# Patient Record
Sex: Female | Born: 1990 | Hispanic: Yes | Marital: Single | Smoking: Current every day smoker
Health system: Southern US, Community
[De-identification: ages and names within clinical notes are randomized; demographics above are authoritative.]

---

## 2017-07-24 ENCOUNTER — Emergency Department (HOSPITAL_COMMUNITY)
Admission: EM | Admit: 2017-07-24 | Discharge: 2017-07-24 | Disposition: A | Discharge status: Home / Self Care | Payer: Self-pay | Attending: Emergency Medicine | Admitting: Emergency Medicine

## 2017-07-24 ENCOUNTER — Emergency Department (HOSPITAL_COMMUNITY): Payer: Self-pay

## 2017-07-24 ENCOUNTER — Encounter (HOSPITAL_COMMUNITY): Payer: Self-pay | Admitting: Physician Assistant

## 2017-07-24 DIAGNOSIS — R102 Pelvic and perineal pain: Secondary | ICD-10-CM | POA: Insufficient documentation

## 2017-07-24 DIAGNOSIS — N898 Other specified noninflammatory disorders of vagina: Secondary | ICD-10-CM | POA: Insufficient documentation

## 2017-07-24 LAB — CBC WITH DIFFERENTIAL/PLATELET
Abs Immature Granulocytes: 0 10*3/uL (ref 0.0–0.1)
Basophils Absolute: 0 10*3/uL (ref 0.0–0.1)
Basophils Relative: 0 %
Eosinophils Absolute: 0.4 10*3/uL (ref 0.0–0.7)
Eosinophils Relative: 4 %
HCT: 43.8 % (ref 36.0–46.0)
Hemoglobin: 13.9 g/dL (ref 12.0–15.0)
Immature Granulocytes: 0 %
Lymphocytes Relative: 26 %
Lymphs Abs: 2.9 10*3/uL (ref 0.7–4.0)
MCH: 28 pg (ref 26.0–34.0)
MCHC: 31.7 g/dL (ref 30.0–36.0)
MCV: 88.3 fL (ref 78.0–100.0)
Monocytes Absolute: 0.7 10*3/uL (ref 0.1–1.0)
Monocytes Relative: 7 %
Neutro Abs: 7.1 10*3/uL (ref 1.7–7.7)
Neutrophils Relative %: 63 %
Platelets: 183 10*3/uL (ref 150–400)
RBC: 4.96 MIL/uL (ref 3.87–5.11)
RDW: 12.8 % (ref 11.5–15.5)
WBC: 11.2 10*3/uL — ABNORMAL HIGH (ref 4.0–10.5)

## 2017-07-24 LAB — COMPREHENSIVE METABOLIC PANEL
ALT: 36 U/L (ref 0–44)
AST: 38 U/L (ref 15–41)
Albumin: 4 g/dL (ref 3.5–5.0)
Alkaline Phosphatase: 64 U/L (ref 38–126)
Anion gap: 10 (ref 5–15)
BUN: <5 mg/dL — ABNORMAL LOW (ref 6–20)
CO2: 22 mmol/L (ref 22–32)
Calcium: 9.3 mg/dL (ref 8.9–10.3)
Chloride: 109 mmol/L (ref 98–111)
Creatinine, Ser: 0.69 mg/dL (ref 0.44–1.00)
GFR calc Af Amer: >60 mL/min (ref >60)
GFR calc non Af Amer: >60 mL/min (ref >60)
Glucose, Bld: 98 mg/dL (ref 70–99)
Potassium: 4.2 mmol/L (ref 3.5–5.1)
Sodium: 141 mmol/L (ref 135–145)
Total Bilirubin: 0.6 mg/dL (ref 0.3–1.2)
Total Protein: 7.2 g/dL (ref 6.5–8.1)

## 2017-07-24 LAB — URINALYSIS, ROUTINE W REFLEX MICROSCOPIC
Bilirubin Urine: NEGATIVE
Glucose, UA: NEGATIVE mg/dL
Hgb urine dipstick: NEGATIVE
Ketones, ur: NEGATIVE mg/dL
Nitrite: NEGATIVE
Protein, ur: NEGATIVE mg/dL
Specific Gravity, Urine: 1.013 (ref 1.005–1.030)
pH: 5 (ref 5.0–8.0)

## 2017-07-24 LAB — WET PREP, GENITAL
Sperm: NONE SEEN
Trich, Wet Prep: NONE SEEN
Yeast Wet Prep HPF POC: NONE SEEN

## 2017-07-24 LAB — PREGNANCY, URINE: PREG TEST UR: NEGATIVE

## 2017-07-24 LAB — LIPASE, BLOOD: Lipase: 28 U/L (ref 11–51)

## 2017-07-24 MED ORDER — AZITHROMYCIN 250 MG PO TABS
1000.0000 mg | ORAL_TABLET | Freq: Once | ORAL | Status: AC
  Administered 2017-07-24: 1000 mg via ORAL
  Filled 2017-07-24: qty 4

## 2017-07-24 MED ORDER — METRONIDAZOLE 500 MG PO TABS: 500.0000 mg | ORAL_TABLET | Freq: Two times a day (BID) | ORAL | 0 refills | Status: DC

## 2017-07-24 MED ORDER — OXYCODONE-ACETAMINOPHEN 5-325 MG PO TABS
1.0000 | ORAL_TABLET | Freq: Once | ORAL | Status: AC
  Administered 2017-07-24: 1 via ORAL
  Filled 2017-07-24: qty 1

## 2017-07-24 MED ORDER — LIDOCAINE HCL (PF) 1 % IJ SOLN
INTRAMUSCULAR | Status: AC
  Administered 2017-07-24: 0.9 mL
  Filled 2017-07-24: qty 5

## 2017-07-24 MED ORDER — HYDROCODONE-ACETAMINOPHEN 5-325 MG PO TABS: 1.0000 | ORAL_TABLET | Freq: Four times a day (QID) | ORAL | 0 refills | Status: DC | PRN

## 2017-07-24 MED ORDER — CEFTRIAXONE SODIUM 250 MG IJ SOLR
250.0000 mg | Freq: Once | INTRAMUSCULAR | Status: AC
  Administered 2017-07-24: 250 mg via INTRAMUSCULAR
  Filled 2017-07-24: qty 250

## 2017-07-24 NOTE — ED Triage Notes (Signed)
Per translator: Patient to ED c/o lower abdominal pain and pelvic pain x 3 days, worse today. She reports it feels cramping like she's on her menstrual cycle, but she just had it last week. Noticed some bloody and yellow discharge in urine. No vaginal discharge, no N/V/D or fevers.

## 2017-07-24 NOTE — Discharge Instructions (Addendum)
Please read attached information. If you experience any new or worsening signs or symptoms please return to the emergency room for evaluation. Please follow-up with your primary care provider or specialist as discussed. Please use medication prescribed only as directed and discontinue taking if you have any concerning signs or symptoms.   °

## 2017-07-24 NOTE — ED Provider Notes (Signed)
[facility] EMERGENCY DEPARTMENT Provider Note   CSN: [id number] Arrival date & time: 07/24/17  1408   History   Chief Complaint Chief Complaint  Patient presents with  . Abdominal Pain    HPI Joyce Ryan is a 27 y.o. female.  HPI Spanish interpreter used  27 year old female presents today with complaints of pelvic pain.  Patient notes 3 days of pelvic pain, vaginal discharge.  She denies any fever chills, she does note vomiting.  Patient reports she is sexually active with men.  She notes the pain is constant with episodes of sharp pain in the pelvis.  Patient denies any change in bowel movement, denies any urinary changes.  Patient notes this was somewhat rapid onset.    History reviewed. No pertinent past medical history.  There are no active problems to display for this patient.   History reviewed. No pertinent surgical history.   OB History   None      Home Medications    Prior to Admission medications   Medication Sig Start Date End Date Taking? Authorizing Provider  HYDROcodone-acetaminophen (NORCO/VICODIN) 5-325 MG tablet Take 1 tablet by mouth every 6 (six) hours as needed. 07/24/17   Leyah Bocchino, Tinnie Gens, PA-C  metroNIDAZOLE (FLAGYL) 500 MG tablet Take 1 tablet (500 mg total) by mouth 2 (two) times daily. 07/24/17   Eyvonne Mechanic, PA-C    Family History No family history on file.  Social History Social History   Tobacco Use  . Smoking status: Not on file  Substance Use Topics  . Alcohol use: Not on file  . Drug use: Not on file     Allergies   Patient has no known allergies.   Review of Systems Review of Systems  All other systems reviewed and are negative.    Physical Exam Updated Vital Signs BP 128/69 (BP Location: Right Arm)   Pulse 75   Temp 98.1 F (36.7 C) (Oral)   Resp 17   Ht 5\' 4"  (1.626 m)   Wt 67.6 kg (149 lb)   LMP 07/20/2017 (Approximate)   SpO2 99%   BMI 25.58 kg/m   Physical Exam    Constitutional: She is oriented to person, place, and time. She appears well-developed and well-nourished.  HENT:  Head: Normocephalic and atraumatic.  Eyes: Pupils are equal, round, and reactive to light. Conjunctivae are normal. Right eye exhibits no discharge. Left eye exhibits no discharge. No scleral icterus.  Neck: Normal range of motion. No JVD present. No tracheal deviation present.  Pulmonary/Chest: Effort normal. No stridor.  Abdominal: Soft.  Minor tenderness palpation of the lower pelvis diffusely worse on the right-remainder of abdomen soft nontender  Genitourinary:  Genitourinary Comments: Small amount of yellow vaginal discharge noted in the vaginal vault, no cervical motion tenderness adnexal tenderness or masses  Neurological: She is alert and oriented to person, place, and time. Coordination normal.  Psychiatric: She has a normal mood and affect. Her behavior is normal. Judgment and thought content normal.  Nursing note and vitals reviewed.    ED Treatments / Results  Labs (all labs ordered are listed, but only abnormal results are displayed) Labs Reviewed  WET PREP, GENITAL - Abnormal; Notable for the following components:      Result Value   Clue Cells Wet Prep HPF POC PRESENT (*)    WBC, Wet Prep HPF POC MANY (*)    All other components within normal limits  CBC WITH DIFFERENTIAL/PLATELET - Abnormal; Notable for the following components:  WBC 11.2 (*)    All other components within normal limits  COMPREHENSIVE METABOLIC PANEL - Abnormal; Notable for the following components:   BUN <5 (*)    All other components within normal limits  URINALYSIS, ROUTINE W REFLEX MICROSCOPIC - Abnormal; Notable for the following components:   Leukocytes, UA TRACE (*)    Bacteria, UA RARE (*)    All other components within normal limits  LIPASE, BLOOD  PREGNANCY, URINE  POC URINE PREG, ED  GC/CHLAMYDIA PROBE AMP ([place]) NOT AT [facility]    EKG None  Radiology Koreas  Transvaginal Non-ob  Result Date: 07/24/2017 CLINICAL DATA:  Initial evaluation for acute lower abdominal pain for 3 days. EXAM: TRANSABDOMINAL AND TRANSVAGINAL ULTRASOUND OF PELVIS DOPPLER ULTRASOUND OF OVARIES TECHNIQUE: Both transabdominal and transvaginal ultrasound examinations of the pelvis were performed. Transabdominal technique was performed for global imaging of the pelvis including uterus, ovaries, adnexal regions, and pelvic cul-de-sac. It was necessary to proceed with endovaginal exam following the transabdominal exam to visualize the uterus, endometrium, and ovaries. Color and duplex Doppler ultrasound was utilized to evaluate blood flow to the ovaries. COMPARISON:  None. FINDINGS: Uterus Measurements: 8.0 x 4.2 x 4.5 cm. No fibroids or other mass visualized. Endometrium Thickness: 6.9 mm.  No focal abnormality visualized. Right ovary Measurements: 3.8 x 2.4 x 2.0 cm. Normal appearance/no adnexal mass. Left ovary Measurements: 3.4 x 2.3 x 2.3 cm. 2.0 x 1.9 x 1.8 cm simple cyst, most consistent with a normal physiologic cyst/dominant follicle. Pulsed Doppler evaluation of both ovaries demonstrates normal low-resistance arterial and venous waveforms. Other findings No abnormal free fluid. IMPRESSION: Normal pelvic ultrasound for age. No evidence for ovarian torsion or other acute abnormality. Electronically Signed   By: Rise MuBenjamin  McClintock M.D.   On: 07/24/2017 19:12   [place] Pelvis Complete  Result Date: 07/24/2017 CLINICAL DATA:  Initial evaluation for acute lower abdominal pain for 3 days. EXAM: TRANSABDOMINAL AND TRANSVAGINAL ULTRASOUND OF PELVIS DOPPLER ULTRASOUND OF OVARIES TECHNIQUE: Both transabdominal and transvaginal ultrasound examinations of the pelvis were performed. Transabdominal technique was performed for global imaging of the pelvis including uterus, ovaries, adnexal regions, and pelvic cul-de-sac. It was necessary to proceed with endovaginal exam following the transabdominal exam to  visualize the uterus, endometrium, and ovaries. Color and duplex Doppler ultrasound was utilized to evaluate blood flow to the ovaries. COMPARISON:  None. FINDINGS: Uterus Measurements: 8.0 x 4.2 x 4.5 cm. No fibroids or other mass visualized. Endometrium Thickness: 6.9 mm.  No focal abnormality visualized. Right ovary Measurements: 3.8 x 2.4 x 2.0 cm. Normal appearance/no adnexal mass. Left ovary Measurements: 3.4 x 2.3 x 2.3 cm. 2.0 x 1.9 x 1.8 cm simple cyst, most consistent with a normal physiologic cyst/dominant follicle. Pulsed Doppler evaluation of both ovaries demonstrates normal low-resistance arterial and venous waveforms. Other findings No abnormal free fluid. IMPRESSION: Normal pelvic ultrasound for age. No evidence for ovarian torsion or other acute abnormality. Electronically Signed   By: Rise MuBenjamin  McClintock M.D.   On: 07/24/2017 19:12   [place] Art/ven Flow Abd Pelv Doppler  Result Date: 07/24/2017 CLINICAL DATA:  Initial evaluation for acute lower abdominal pain for 3 days. EXAM: TRANSABDOMINAL AND TRANSVAGINAL ULTRASOUND OF PELVIS DOPPLER ULTRASOUND OF OVARIES TECHNIQUE: Both transabdominal and transvaginal ultrasound examinations of the pelvis were performed. Transabdominal technique was performed for global imaging of the pelvis including uterus, ovaries, adnexal regions, and pelvic cul-de-sac. It was necessary to proceed with endovaginal exam following the transabdominal exam to visualize the uterus, endometrium,  and ovaries. Color and duplex Doppler ultrasound was utilized to evaluate blood flow to the ovaries. COMPARISON:  None. FINDINGS: Uterus Measurements: 8.0 x 4.2 x 4.5 cm. No fibroids or other mass visualized. Endometrium Thickness: 6.9 mm.  No focal abnormality visualized. Right ovary Measurements: 3.8 x 2.4 x 2.0 cm. Normal appearance/no adnexal mass. Left ovary Measurements: 3.4 x 2.3 x 2.3 cm. 2.0 x 1.9 x 1.8 cm simple cyst, most consistent with a normal physiologic cyst/dominant  follicle. Pulsed Doppler evaluation of both ovaries demonstrates normal low-resistance arterial and venous waveforms. Other findings No abnormal free fluid. IMPRESSION: Normal pelvic ultrasound for age. No evidence for ovarian torsion or other acute abnormality. Electronically Signed   By: Rise Mu M.D.   On: 07/24/2017 19:12    Procedures Procedures (including critical care time)  Medications Ordered in ED Medications  lidocaine (PF) (XYLOCAINE) 1 % injection (has no administration in time range)  oxyCODONE-acetaminophen (PERCOCET/ROXICET) 5-325 MG per tablet 1 tablet (1 tablet Oral Given 07/24/17 1713)  cefTRIAXone (ROCEPHIN) injection 250 mg (250 mg Intramuscular Given 07/24/17 1950)  azithromycin (ZITHROMAX) tablet 1,000 mg (1,000 mg Oral Given 07/24/17 1949)     Initial Impression / Assessment and Plan / ED Course  I have reviewed the triage vital signs and the nursing notes.  Pertinent labs & imaging results that were available during my care of the patient were reviewed by me and considered in my medical decision making (see chart for details).     Labs: Wet prep, urinalysis, urine prior, CBC, CMP, lipase  Imaging: Ultrasound pelvis complete  Consults:  Therapeutics: Ceftriaxone, azithromycin  Discharge Meds: Metronidazole, Vicodin  Assessment/Plan: 27 year old female presents today with complaints of pelvic pain and vaginal discharge.  Patient's exam concerning for STD, she has a painful exam with no significant changes with cervical motion, no true cervical motion tenderness, no adnexal tenderness or masses.  Ultrasound reassuring with no signs of torsion.  Remainder of patient's work-up reassuring with no fever.  No signs of PID, very low suspicion for acute appendicitis.  I had a discussion with the patient, she will be treated here prophylactically for STDs discharge with metronidazole for bacterial vaginosis.  If patients symptoms continue to persist beyond 2 days  she will return to the emergency room for repeat evaluation, if she has any new or worsening signs or symptoms she will return immediately.  Patient verbalized understanding and agreement to today's plan had no further questions or concerns at time discharge.    Final Clinical Impressions(s) / ED Diagnoses   Final diagnoses:  Vaginal discharge  Pelvic pain    ED Discharge Orders        Ordered    metroNIDAZOLE (FLAGYL) 500 MG tablet  2 times daily     07/24/17 1949    HYDROcodone-acetaminophen (NORCO/VICODIN) 5-325 MG tablet  Every 6 hours PRN     07/24/17 1949       Rosalio Loud 07/24/17 Rayne Du, MD 07/27/17 1006

## 2017-07-24 NOTE — ED Provider Notes (Addendum)
Patient placed in Quick Look pathway, seen and evaluated   Chief Complaint: abdominal pain  HPI:   Patient presents for evaluation of 3 days of lower abdominal pain, constant. Notes nausea but no vomiting. Thinks she may have had hematuria this morning. Denies vaginal itching, bleeding, discharge out of the ordinary. Denies fevers, diarrhea, constipation.patient is primarily Spanish-speaking, translator used throughout the duration of the encounter.  ROS: positive for abdominal pain, hematuria, nausea Negative for vomiting, fever, diarrhea, constipation  Physical Exam:   Gen: intermittently tearful  Neuro: Awake and Alert  Skin: Warm    Focused Exam:tenderness to palpation of the lower abdomen. Murphy sign absent, Rovsing's absent, mild bilateral CVA tenderness present. No peritoneal signs.   Initiation of care has begun. The patient has been counseled on the process, plan, and necessity for staying for the completion/evaluation, and the remainder of the medical screening examination     Jeanie SewerFawze, Catalaya Garr A, PA-C 07/24/17 1521    Blane OharaZavitz, Joshua, MD 07/25/17 1047

## 2017-07-25 LAB — GC/CHLAMYDIA PROBE AMP (~~LOC~~) NOT AT ARMC
Chlamydia: NEGATIVE
Neisseria Gonorrhea: NEGATIVE

## 2018-02-21 ENCOUNTER — Other Ambulatory Visit: Payer: Self-pay

## 2018-02-21 ENCOUNTER — Encounter (HOSPITAL_COMMUNITY): Payer: Self-pay | Admitting: *Deleted

## 2018-02-21 ENCOUNTER — Emergency Department (HOSPITAL_COMMUNITY)
Admission: EM | Admit: 2018-02-21 | Discharge: 2018-02-21 | Disposition: A | Discharge status: Home / Self Care | Payer: Self-pay | Attending: Emergency Medicine | Admitting: Emergency Medicine

## 2018-02-21 ENCOUNTER — Emergency Department (HOSPITAL_COMMUNITY): Payer: Self-pay

## 2018-02-21 DIAGNOSIS — O99331 Smoking (tobacco) complicating pregnancy, first trimester: Secondary | ICD-10-CM | POA: Insufficient documentation

## 2018-02-21 DIAGNOSIS — O9989 Other specified diseases and conditions complicating pregnancy, childbirth and the puerperium: Secondary | ICD-10-CM | POA: Insufficient documentation

## 2018-02-21 DIAGNOSIS — Z3A01 Less than 8 weeks gestation of pregnancy: Secondary | ICD-10-CM | POA: Insufficient documentation

## 2018-02-21 DIAGNOSIS — R1030 Lower abdominal pain, unspecified: Secondary | ICD-10-CM | POA: Insufficient documentation

## 2018-02-21 DIAGNOSIS — F17210 Nicotine dependence, cigarettes, uncomplicated: Secondary | ICD-10-CM | POA: Insufficient documentation

## 2018-02-21 LAB — ABO/RH: ABO/RH(D): A POS

## 2018-02-21 LAB — URINALYSIS, ROUTINE W REFLEX MICROSCOPIC
Bilirubin Urine: NEGATIVE
Glucose, UA: NEGATIVE mg/dL
Hgb urine dipstick: NEGATIVE
Ketones, ur: NEGATIVE mg/dL
LEUKOCYTES UA: NEGATIVE
Nitrite: NEGATIVE
PROTEIN: NEGATIVE mg/dL
SPECIFIC GRAVITY, URINE: 1.023 (ref 1.005–1.030)
pH: 7 (ref 5.0–8.0)

## 2018-02-21 LAB — WET PREP, GENITAL
CLUE CELLS WET PREP: NONE SEEN
Sperm: NONE SEEN
TRICH WET PREP: NONE SEEN
YEAST WET PREP: NONE SEEN

## 2018-02-21 LAB — POC URINE PREG, ED: Preg Test, Ur: POSITIVE — AB

## 2018-02-21 LAB — HCG, QUANTITATIVE, PREGNANCY: hCG, Beta Chain, Quant, S: 2723 m[IU]/mL — ABNORMAL HIGH (ref <5)

## 2018-02-21 NOTE — ED Notes (Signed)
Patient transported to Ultrasound 

## 2018-02-21 NOTE — ED Provider Notes (Signed)
Assumed care of patient from Dr. Madilyn Hook at change of care, briefly, normal pelvic exam, cc pain and vaginal dc. Awaiting [place] and quant. No bleeding, wet prep normal.  Physical Exam  BP 137/90 (BP Location: Left Arm)   Pulse 72   Temp 98.7 F (37.1 C) (Oral)   Resp 18   Ht 5\' 4"  (1.626 m)   Wt 67.1 kg   LMP 01/18/2018   SpO2 99%   BMI 25.40 kg/m   Physical Exam  ED Course/Procedures     Procedures  MDM  Ultrasound returns with 5-week IUP, cardiac activity and fetal pole not seen, recommend follow-up in 2 weeks.  Patient advised to take prenatal vitamin daily and follow-up with women's outpatient clinic.       Jeannie Fend, PA-C 02/21/18 1947    Tilden Fossa, MD 02/26/18 [phone]

## 2018-02-21 NOTE — ED Triage Notes (Cosign Needed)
C/o abd. Pain with vomiting , states her last menstrual period was 12/26.

## 2018-02-21 NOTE — Discharge Instructions (Addendum)
Ests embarazada de 5 semanas. Tome una vitamina prenatal [employer]. Haga un seguimiento con la clnica ambulatoria para mujeres o vea su obstetra.

## 2018-02-21 NOTE — ED Notes (Signed)
E-signature not available, pt verbalized understanding DC instructions.

## 2018-02-21 NOTE — ED Provider Notes (Addendum)
[facility] Provider Note   CSN: [id number] Arrival date & time: 02/21/18  1433     History   Chief Complaint Chief Complaint  Patient presents with  . Abdominal Pain    HPI Joyce Ryan is a 28 y.o. female.  The history is provided by the patient. A language interpreter was used.  Abdominal Pain   Joyce Ryan is a 28 y.o. female who presents to the Emergency Department complaining of abdominal pain. Presents to the ED for evaluation of lower abdominal pain for the last seven days. Pain is described as cramping and  Period type pain. She has associated nausea and vomiting. She also describes yellow vaginally discharge. Her LMP was on December 26. She did have a positive home pregnancy test. She has been sexually active without protection while traveling in the [place].  She has associated headache and lightheadedness.  No fevers, diarrhea, dysuria.   History reviewed. No pertinent past medical history.  There are no active problems to display for this patient.   Past Surgical History:  Procedure Laterality Date  . CESAREAN SECTION       OB History   No obstetric history on file.      Home Medications    Prior to Admission medications   Medication Sig Start Date End Date Taking? Authorizing Provider  HYDROcodone-acetaminophen (NORCO/VICODIN) 5-325 MG tablet Take 1 tablet by mouth every 6 (six) hours as needed. 07/24/17   Hedges, Tinnie GensJeffrey, PA-C  metroNIDAZOLE (FLAGYL) 500 MG tablet Take 1 tablet (500 mg total) by mouth 2 (two) times daily. 07/24/17   Eyvonne MechanicHedges, Jeffrey, PA-C    Family History No family history on file.  Social History Social History   Tobacco Use  . Smoking status: Current Every Day Smoker  . Smokeless tobacco: Never Used  Substance Use Topics  . Alcohol use: Yes  . Drug use: Never     Allergies   Patient has no known allergies.   Review of Systems Review of Systems    Gastrointestinal: Positive for abdominal pain.  All other systems reviewed and are negative.    Physical Exam Updated Vital Signs BP 137/90 (BP Location: Left Arm)   Pulse 72   Temp 98.7 F (37.1 C) (Oral)   Resp 18   Ht 5\' 4"  (1.626 m)   Wt 67.1 kg   LMP 01/18/2018   SpO2 99%   BMI 25.40 kg/m   Physical Exam Vitals signs and nursing note reviewed. Exam conducted with a chaperone present.  Constitutional:      Appearance: She is well-developed.  HENT:     Head: Normocephalic and atraumatic.  Cardiovascular:     Rate and Rhythm: Normal rate and regular rhythm.  Pulmonary:     Effort: Pulmonary effort is normal. No respiratory distress.     Breath sounds: Normal breath sounds.  Abdominal:     Palpations: Abdomen is soft.     Tenderness: There is no guarding or rebound.     Comments: Mild suprapubic tenderness  Genitourinary:    Comments: Scant amount of white vaginally discharge. No CMT or adnexal tenderness Musculoskeletal:        General: No tenderness.  Skin:    General: Skin is warm and dry.  Neurological:     Mental Status: She is alert and oriented to person, place, and time.  Psychiatric:        Behavior: Behavior normal.      ED Treatments /  Results  Labs (all labs ordered are listed, but only abnormal results are displayed) Labs Reviewed  WET PREP, GENITAL  URINALYSIS, ROUTINE W REFLEX MICROSCOPIC  RPR  HIV ANTIBODY (ROUTINE TESTING W REFLEX)  GC/CHLAMYDIA PROBE AMP ([place]) NOT AT [facility]    EKG None  Radiology No results found.  Procedures Procedures (including critical care time)  Medications Ordered in ED Medications - No data to display   Initial Impression / Assessment and Plan / ED Course  I have reviewed the triage vital signs and the nursing notes.  Pertinent labs & imaging results that were available during my care of the patient were reviewed by me and considered in my medical decision making (see chart for details).      Patient here for evaluation of lower abdominal pain, vaginally discharge and vomiting. She did have a positive home pregnancy test. She does have mild tenderness on examination without peritoneal findings. Pelvic examination is benign. Urine pregnancy is positive. Plan to obtain pelvic ultrasound to rule out ectopic. Patient care transferred pending ultrasound.  Examination is not consistent with PID, tubo-ovarian abscess, appendicitis.  Final Clinical Impressions(s) / ED Diagnoses   Final diagnoses:  None    ED Discharge Orders    None       Tilden Fossa, MD 02/21/18 Tori Milks, MD 02/21/18 1904

## 2018-02-22 LAB — GC/CHLAMYDIA PROBE AMP (~~LOC~~) NOT AT ARMC
Chlamydia: NEGATIVE
NEISSERIA GONORRHEA: NEGATIVE

## 2018-02-22 LAB — RPR: RPR Ser Ql: NONREACTIVE

## 2018-02-22 LAB — HIV ANTIBODY (ROUTINE TESTING W REFLEX): HIV Screen 4th Generation wRfx: NONREACTIVE

## 2018-03-05 ENCOUNTER — Emergency Department (HOSPITAL_COMMUNITY): Admission: EM | Admit: 2018-03-05 | Discharge: 2018-03-05 | Discharge status: Against Medical Advice | Payer: Self-pay

## 2019-11-01 IMAGING — US US OB COMP LESS 14 WK
1 series · 14 of 28 positions shown · non-contrast
Comparison: None.

CLINICAL DATA: Pelvic pain. Gestational age by last menstrual
period 4 weeks and 6 days.

EXAM:
OBSTETRIC <14 WK US AND TRANSVAGINAL OB US
TECHNIQUE: Both transabdominal and transvaginal ultrasound examinations were
performed for complete evaluation of the gestation as well as the
maternal uterus, adnexal regions, and pelvic cul-de-sac.
Transvaginal technique was performed to assess early pregnancy.

[Series 1: us ob comp less 14 wk · 14 of 30 slices shown]
[im 2/30]
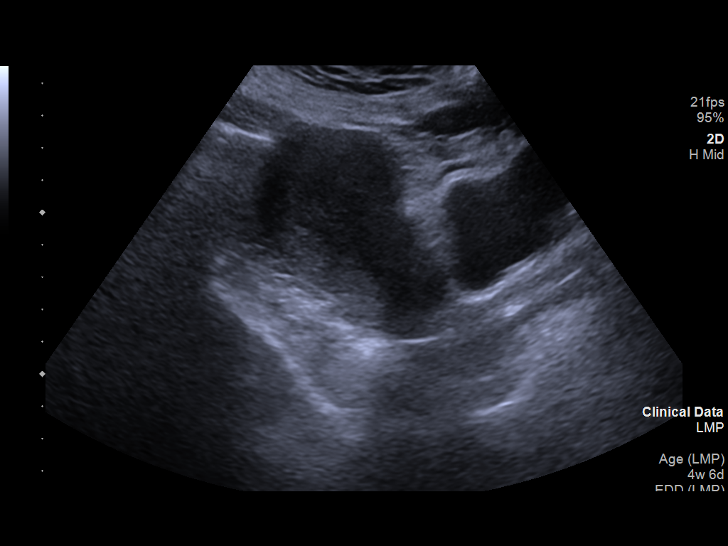
[im 4/30]
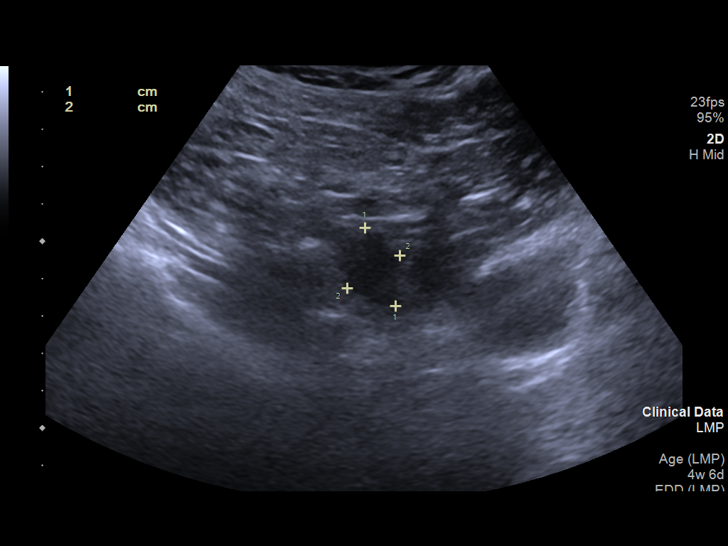
[im 6/30]
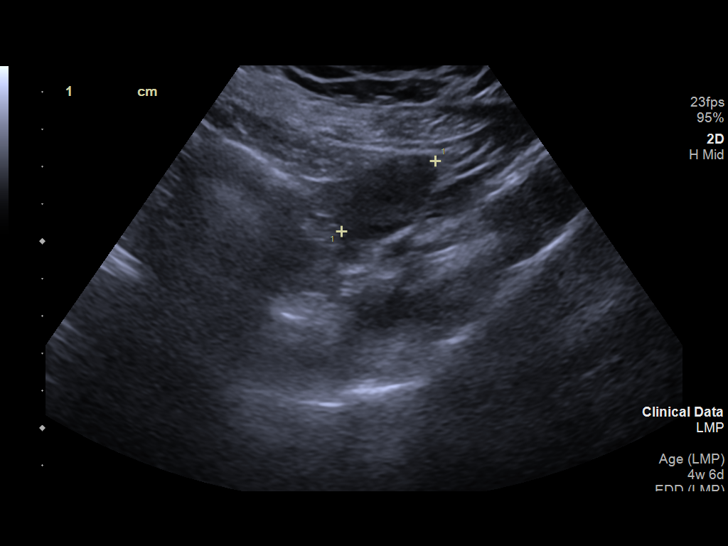
[im 8/30]
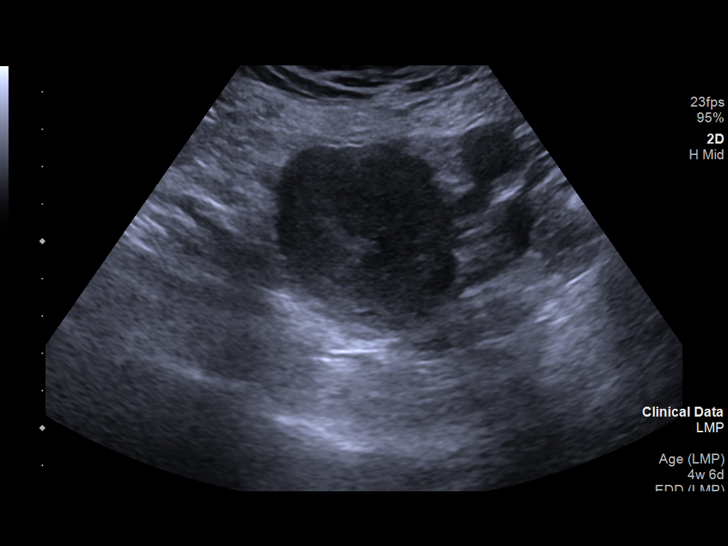
[im 10/30]
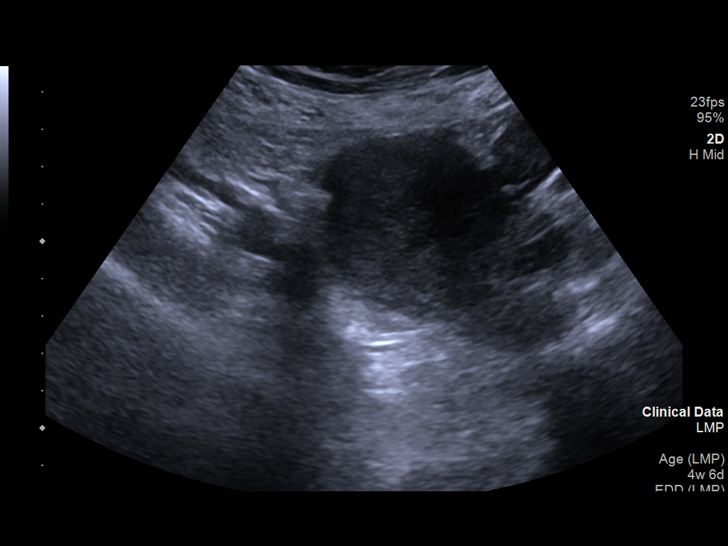
[im 12/30]
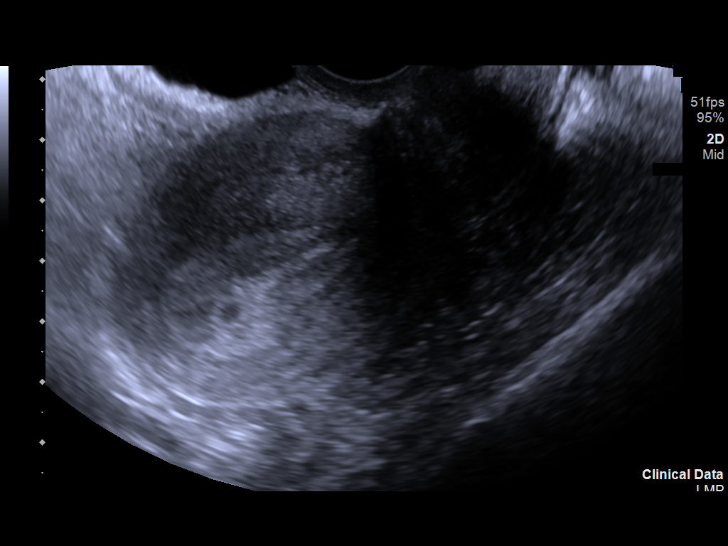
[im 14/30]
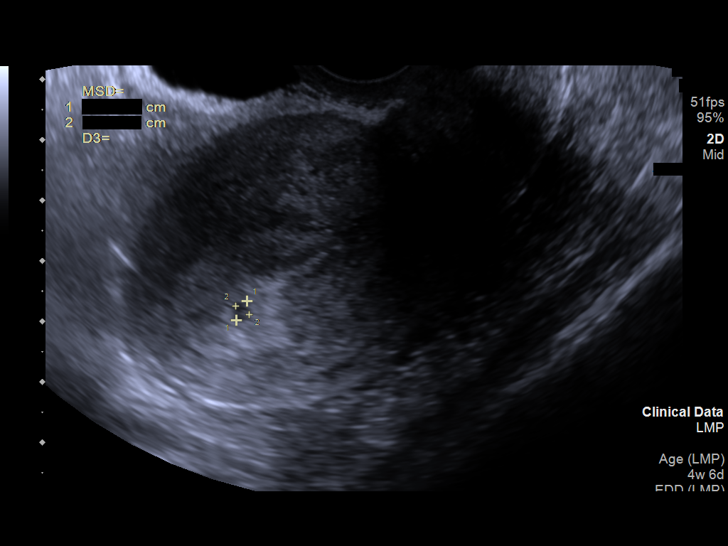
[im 17/30]
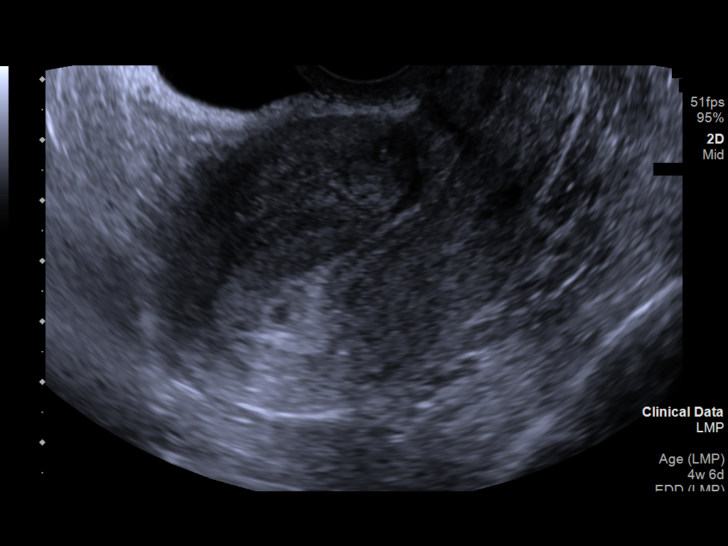
[im 19/30]
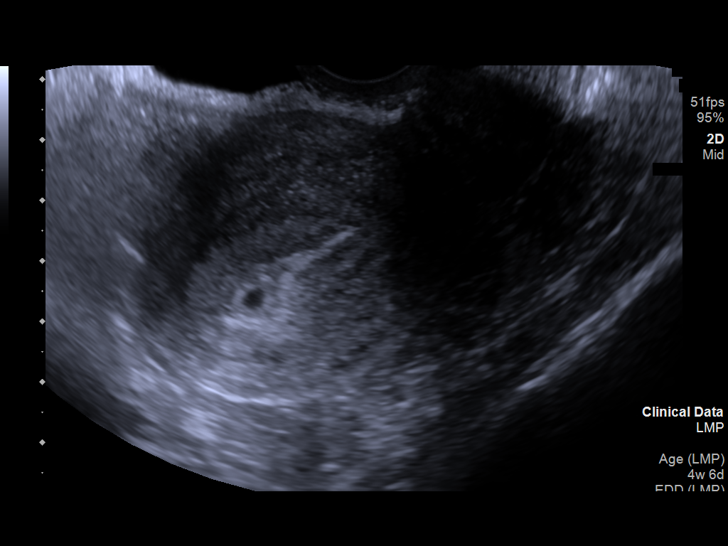
[im 21/30]
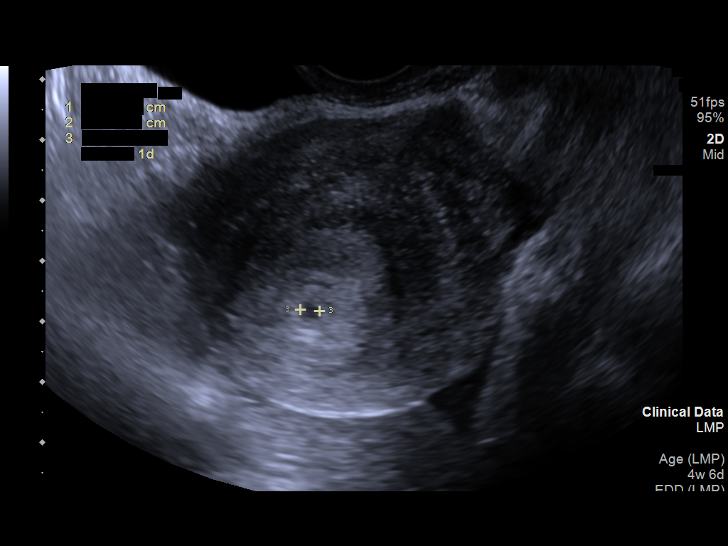
[im 23/30]
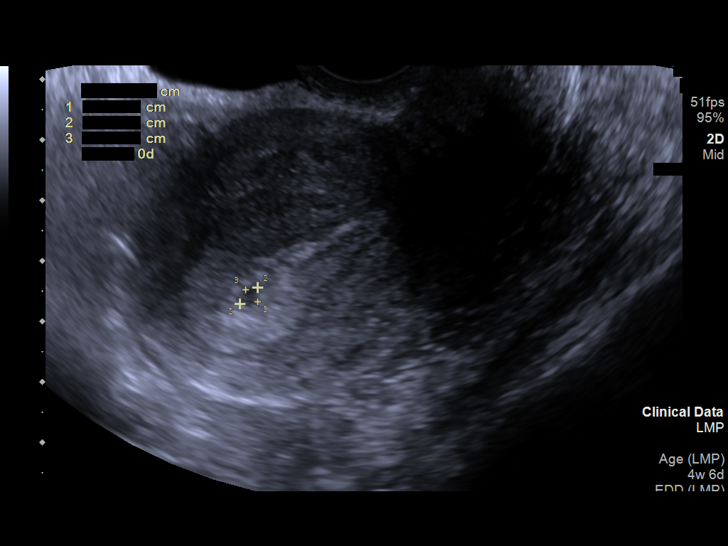
[im 25/30]
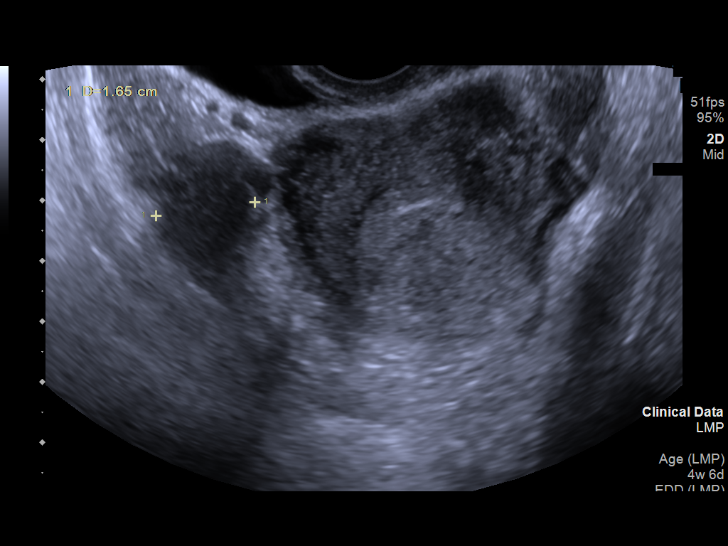
[im 27/30]
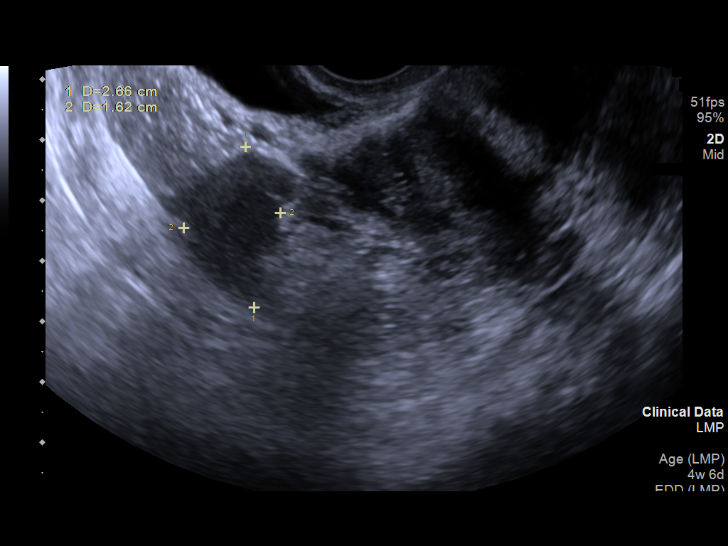
[im 30/30]
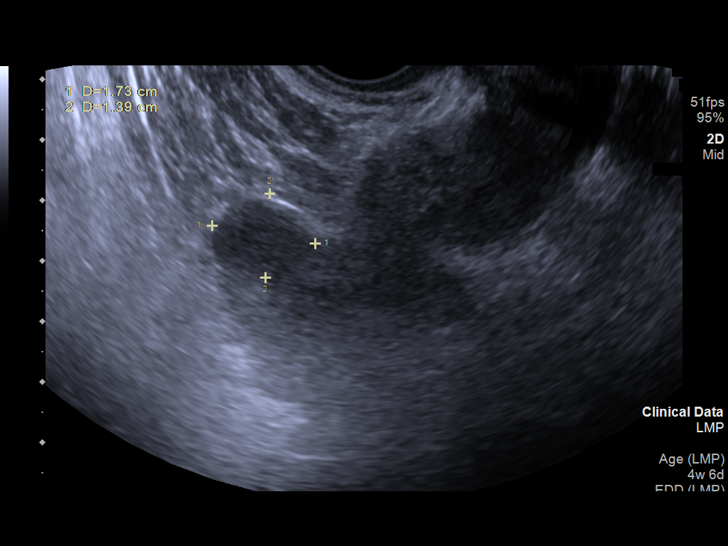

[14 of 28 positions shown; findings below may reference images not displayed]

FINDINGS: Intrauterine gestational sac: Present.

Yolk sac:  Not present.

Embryo:  Not present.

Cardiac Activity: None present.

MSD: 3 mm   5 w   0 d

Subchorionic hemorrhage:  None visualized.

Maternal uterus/adnexae: Normal appearance of the adnexa. No free
fluid.
IMPRESSION: Probable early intrauterine gestational sac, but no yolk sac, fetal
pole, or cardiac activity yet visualized. Recommend follow-up
quantitative B-HCG levels and follow-up US in 14 days to confirm and
assess viability. This recommendation follows SRU consensus
guidelines: Diagnostic Criteria for Nonviable Pregnancy Early in the
First Trimester. N Engl J Med 6224; [DATE].

## 2020-05-19 ENCOUNTER — Other Ambulatory Visit: Payer: Self-pay

## 2020-05-19 ENCOUNTER — Encounter (HOSPITAL_COMMUNITY): Payer: Self-pay | Admitting: Obstetrics and Gynecology

## 2020-05-19 ENCOUNTER — Inpatient Hospital Stay (HOSPITAL_COMMUNITY)
Admission: AD | Admit: 2020-05-19 | Discharge: 2020-05-19 | Disposition: A | Discharge status: Home / Self Care | Payer: Self-pay | Attending: Obstetrics and Gynecology | Admitting: Obstetrics and Gynecology

## 2020-05-19 DIAGNOSIS — B373 Candidiasis of vulva and vagina: Secondary | ICD-10-CM

## 2020-05-19 DIAGNOSIS — B9689 Other specified bacterial agents as the cause of diseases classified elsewhere: Secondary | ICD-10-CM | POA: Insufficient documentation

## 2020-05-19 DIAGNOSIS — O34219 Maternal care for unspecified type scar from previous cesarean delivery: Secondary | ICD-10-CM

## 2020-05-19 DIAGNOSIS — O99331 Smoking (tobacco) complicating pregnancy, first trimester: Secondary | ICD-10-CM | POA: Insufficient documentation

## 2020-05-19 DIAGNOSIS — O26891 Other specified pregnancy related conditions, first trimester: Secondary | ICD-10-CM

## 2020-05-19 DIAGNOSIS — Z3A12 12 weeks gestation of pregnancy: Secondary | ICD-10-CM | POA: Insufficient documentation

## 2020-05-19 DIAGNOSIS — O34211 Maternal care for low transverse scar from previous cesarean delivery: Secondary | ICD-10-CM | POA: Insufficient documentation

## 2020-05-19 DIAGNOSIS — F17200 Nicotine dependence, unspecified, uncomplicated: Secondary | ICD-10-CM | POA: Insufficient documentation

## 2020-05-19 DIAGNOSIS — N760 Acute vaginitis: Secondary | ICD-10-CM

## 2020-05-19 DIAGNOSIS — O210 Mild hyperemesis gravidarum: Secondary | ICD-10-CM | POA: Insufficient documentation

## 2020-05-19 DIAGNOSIS — O219 Vomiting of pregnancy, unspecified: Secondary | ICD-10-CM

## 2020-05-19 DIAGNOSIS — O23591 Infection of other part of genital tract in pregnancy, first trimester: Secondary | ICD-10-CM | POA: Insufficient documentation

## 2020-05-19 DIAGNOSIS — R109 Unspecified abdominal pain: Secondary | ICD-10-CM

## 2020-05-19 DIAGNOSIS — R1030 Lower abdominal pain, unspecified: Secondary | ICD-10-CM | POA: Insufficient documentation

## 2020-05-19 LAB — WET PREP, GENITAL
Sperm: NONE SEEN
Trich, Wet Prep: NONE SEEN
Yeast Wet Prep HPF POC: NONE SEEN

## 2020-05-19 LAB — URINALYSIS, ROUTINE W REFLEX MICROSCOPIC
Bilirubin Urine: NEGATIVE
Glucose, UA: NEGATIVE mg/dL
Hgb urine dipstick: NEGATIVE
Ketones, ur: NEGATIVE mg/dL
Leukocytes,Ua: NEGATIVE
Nitrite: NEGATIVE
Protein, ur: NEGATIVE mg/dL
Specific Gravity, Urine: 1.005 (ref 1.005–1.030)
pH: 6 (ref 5.0–8.0)

## 2020-05-19 MED ORDER — METRONIDAZOLE 500 MG PO TABS: 500.0000 mg | ORAL_TABLET | Freq: Two times a day (BID) | ORAL | 0 refills | Status: AC

## 2020-05-19 NOTE — MAU Provider Note (Signed)
History     CSN: [id number]  Arrival date and time: 05/19/20 0934   Event Date/Time   First Provider Initiated Contact with Patient 05/19/20 1110      Chief Complaint  Patient presents with  . Abdominal Pain   Ms. Joyce Ryan is a 30 y.o. G3P2000 at [redacted]w[redacted]d who presents to MAU for lower abdominal pain. Patient reports the pain started about 4 days ago. Patient reports the pain is located over her C/S scar. Patient has a history of 2 C/S and reports her last C/S was 9 years ago. Patient reports the pain feels like menstrual-like cramping. Patient reports the pain is intermittent and feels like contractions. Patient reports she has not taken anything for the pain, she has just tried sleeping, which works to take away the pain. Patient reports the pain is not present at this time.  Patient reports nausea and vomiting started about one month ago. Patient reports since that time she has been vomiting about 5-6 times each day. Patient reports she is able to eat and drink rice, soups and drinks water. Patient reports she has not taken anything for the nausea and vomiting.  Pt denies VB, LOF, ctx, decreased FM, vaginal discharge/odor/itching. Pt denies constipation, diarrhea, or urinary problems. Pt denies fever, chills, fatigue, sweating or changes in appetite. Pt denies SOB or chest pain. Pt denies dizziness, HA, light-headedness, weakness.  Problems this pregnancy include: pt has not yet been seen. Allergies? NKDA Current medications/supplements? none  Spanish interpreter used for entire visit.   OB History    Gravida  3   Para  2   Term  2   Preterm      AB      Living        SAB      IAB      Ectopic      Multiple      Live Births              History reviewed. No pertinent past medical history.  Past Surgical History:  Procedure Laterality Date  . CESAREAN SECTION      No family history on file.  Social History   Tobacco Use  . Smoking  status: Current Every Day Smoker  . Smokeless tobacco: Never Used  Substance Use Topics  . Alcohol use: Yes  . Drug use: Never    Allergies: No Known Allergies  No medications prior to admission.    Review of Systems  Constitutional: Negative for chills, diaphoresis, fatigue and fever.  Eyes: Negative for visual disturbance.  Respiratory: Negative for shortness of breath.   Cardiovascular: Negative for chest pain.  Gastrointestinal: Positive for abdominal pain, nausea and vomiting. Negative for constipation and diarrhea.  Genitourinary: Negative for dysuria, flank pain, frequency, pelvic pain, urgency, vaginal bleeding and vaginal discharge.  Neurological: Negative for dizziness, weakness, light-headedness and headaches.   Physical Exam   Blood pressure 135/72, pulse 73, temperature 98.1 F (36.7 C), temperature source Oral, resp. rate 18, height 5\' 3"  (1.6 m), weight 73.2 kg, last menstrual period 02/25/2020, SpO2 100 %.  Patient Vitals for the past 24 hrs:  BP Temp Temp src Pulse Resp SpO2 Height Weight  05/19/20 1253 135/72 -- -- 73 -- 100 % -- --  05/19/20 1028 128/85 98.1 F (36.7 C) Oral 72 18 100 % 5\' 3"  (1.6 m) 73.2 kg   Physical Exam Vitals and nursing note reviewed. Exam conducted with a chaperone present.  Constitutional:  General: She is not in acute distress.    Appearance: Normal appearance. She is not ill-appearing, toxic-appearing or diaphoretic.  HENT:     Head: Normocephalic and atraumatic.  Pulmonary:     Effort: Pulmonary effort is normal.  Abdominal:     General: There is no distension.     Palpations: Abdomen is soft. There is no mass.     Tenderness: There is abdominal tenderness (localized to c/s scar). There is no guarding.  Skin:    General: Skin is warm and dry.  Neurological:     Mental Status: She is alert and oriented to person, place, and time.  Psychiatric:        Mood and Affect: Mood normal.        Behavior: Behavior normal.         Thought Content: Thought content normal.        Judgment: Judgment normal.     Results for orders placed or performed during the hospital encounter of 05/19/20 (from the past 24 hour(s))  Wet prep, genital     Status: Abnormal   Collection Time: 05/19/20 10:40 AM   Specimen: Vaginal  Result Value Ref Range   Yeast Wet Prep HPF POC NONE SEEN NONE SEEN   Trich, Wet Prep NONE SEEN NONE SEEN   Clue Cells Wet Prep HPF POC PRESENT (A) NONE SEEN   WBC, Wet Prep HPF POC MANY (A) NONE SEEN   Sperm NONE SEEN   Urinalysis, Routine w reflex microscopic Urine, Clean Catch     Status: Abnormal   Collection Time: 05/19/20 11:58 AM  Result Value Ref Range   Color, Urine STRAW (A) YELLOW   APPearance CLEAR CLEAR   Specific Gravity, Urine 1.005 1.005 - 1.030   pH 6.0 5.0 - 8.0   Glucose, UA NEGATIVE NEGATIVE mg/dL   Hgb urine dipstick NEGATIVE NEGATIVE   Bilirubin Urine NEGATIVE NEGATIVE   Ketones, ur NEGATIVE NEGATIVE mg/dL   Protein, ur NEGATIVE NEGATIVE mg/dL   Nitrite NEGATIVE NEGATIVE   Leukocytes,Ua NEGATIVE NEGATIVE   No results found.   MAU Course  Procedures  MDM -N/V + abdominal pain -abdominal tenderness only over C/S scar -cervix closed/long/posterior on exam -FHR 166 (obtained in triage) -UA: straw, otherwise WNL, sending urine for culture -WetPrep: +ClueCells -GC/CT collected -pt discharged to home in stable condition  Orders Placed This Encounter  Procedures  . Wet prep, genital    Standing Status:   Standing    Number of Occurrences:   1  . Culture, OB Urine    Standing Status:   Standing    Number of Occurrences:   1  . Urinalysis, Routine w reflex microscopic Urine, Clean Catch    Standing Status:   Standing    Number of Occurrences:   1  . Discharge patient    Order Specific Question:   Discharge disposition    Answer:   01-Home or Self Care [1]    Order Specific Question:   Discharge patient date    Answer:   05/19/2020   Meds ordered this encounter   Medications  . metroNIDAZOLE (FLAGYL) 500 MG tablet    Sig: Take 1 tablet (500 mg total) by mouth 2 (two) times daily for 7 days.    Dispense:  14 tablet    Refill:  0    Order Specific Question:   Supervising Provider    Answer:   Gazelle Bing [[id number]]   Assessment and Plan   1. Abdominal  pain during pregnancy in first trimester   2. [redacted] weeks gestation of pregnancy   3. Nausea and vomiting in pregnancy   4. Bacterial vaginosis     Allergies as of 05/19/2020   No Known Allergies     Medication List    TAKE these medications   metroNIDAZOLE 500 MG tablet Commonly known as: Flagyl Take 1 tablet (500 mg total) by mouth 2 (two) times daily for 7 days.      -will call with culture results, if positive -list of OB providers given -safe meds in pregnancy list given -RX metronidazole -return MAU precautions given -pt discharged to home in stable condition  Joni Reining E Petrita Blunck 05/19/2020, 1:01 PM

## 2020-05-19 NOTE — Discharge Instructions (Signed)
Dolor abdominal durante el embarazo Abdominal Pain During Pregnancy El dolor abdominal es comn durante el embarazo y tiene muchas causas posibles. Algunas causas son ms graves que otras, y a Advertising account executive causa se desconoce. El dolor abdominal puede ser un indicio de que est comenzando el [place]. Tambin puede ser ocasionado por el crecimiento normal del beb que provoca estiramiento de los msculos y ligamentos durante el Psychiatrist. Siempre informe a su mdico si siente dolor abdominal. Siga estas instrucciones en su casa:  No tenga relaciones sexuales ni se coloque nada dentro de la vagina hasta que el dolor haya desaparecido completamente.  Descanse todo lo que pueda [employer]Shack dolor se le haya calmado.  Beba suficiente lquido como para Pharmacologist la orina de color amarillo plido.  Use los medicamentos de venta libre y los recetados solamente como se lo haya indicado el mdico.  Cumpla con todas las visitas de seguimiento. Esto es importante.   Comunquese con un mdico si:  El dolor contina o empeora despus de Lawyer.  Siente dolor en la parte inferior del abdomen que: ? Va y viene en intervalos regulares. ? Se extiende a la espalda. ? Es parecido a los Tree surgeon.  Siente dolor o ardor al Geographical information systems officer. Solicite ayuda de inmediato si:  Tiene fiebre, escalofros o falta de aire.  Tiene una hemorragia vaginal abundante.  Tiene prdida de lquidos o elimina tejidos por la vagina.  Vomita o tiene diarrea durante ms de 24horas.  El beb se mueve menos de lo habitual.  Se siente dbil o se desmaya.  Siente dolor intenso en la parte superior del abdomen. Resumen  El dolor abdominal es comn durante el [employer] y tiene muchas causas posibles.  Si siente dolor abdominal durante el embarazo, informe al mdico de inmediato.  Siga las instrucciones del mdico para el cuidado en el hogar y concurra a todas las visitas de seguimiento como se lo hayan indicado. Esta  informacin no tiene Theme park manager el consejo del mdico. Asegrese de hacerle al mdico cualquier pregunta que tenga. Document Revised: 10/31/2019 Document Reviewed: 10/31/2019 Elsevier Patient Education  2021 Elsevier Inc.        Vaginosis bacteriana Bacterial Vaginosis  La vaginosis bacteriana es una infeccin que ocurre cuando cambia el equilibrio normal de las bacterias que se encuentran en la vagina. La causa de este cambio es la proliferacin excesiva de ciertas bacterias en la vagina. La vaginosis bacteriana es la infeccin vaginal ms frecuente EMCOR de 15 a 44aos. Esta afeccin aumenta el riesgo de tener infecciones de transmisin sexual (ITS). El tratamiento puede ayudar a Software engineer. El tratamiento es muy importante para las mujeres embarazadas porque esta afeccin puede provocar que los bebs nazcan antes de tiempo (prematuramente) o con bajo peso. Cules son las causas? Esta afeccin se origina por un aumento de bacterias nocivas que, generalmente, estn presentes en cantidades pequeas en la vagina. No obstante, no se conoce el motivo exacto de la aparicin de esta afeccin. El contagio no se produce en baos, por ropas de cama, en piscinas ni por contacto con objetos. Qu incrementa el riesgo? Los siguientes factores pueden hacer que sea ms propenso a Aeronautical engineer afeccin:  Tener una nueva pareja sexual o mltiples parejas sexuales, o tener relaciones sexuales sin proteccin.  Hacerse duchas vaginales.  Tener colocado un dispositivo intrauterino(DIU).  Fumar.  Consumir drogas y alcohol en exceso. Esto puede llevar a una conducta sexual ms riesgosa.  Tomar ciertos antibiticos.  Estar  embarazada. Cules son los signos o sntomas? Algunas mujeres con esta afeccin no manifiestan ningn sntoma. Entre los sntomas, se pueden incluir los siguientes:  Secrecin vaginal de color gris o blanco. La secrecin puede ser acuosa o  espumosa.  Secrecin vaginal con olor similar al [employer]; en especial, despus de Art gallery manager sexuales o durante la menstruacin.  Picazn en la vagina y alrededor de esta.  Ardor o dolor al ConocoPhillips. Cmo se diagnostica? Esta afeccin se diagnostica en funcin de lo siguiente:  Sus antecedentes mdicos.  Examen fsico de la vagina.  [place] de [place] de lquido vaginal para detectar bacterias nocivas o clulas anormales. Cmo se trata? Esta afeccin se trata con antibiticos. Podran administrarse en forma de pastillas, de una crema vaginal o de un medicamento que se coloca dentro de la vagina(vulo vaginal). Si la afeccin se repite despus del tratamiento, podra indicarse una segunda tanda de antibiticos. Siga estas instrucciones en su casa: Medicamentos  Tome o aplquese los medicamentos de venta libre y los recetados solamente como se lo haya indicado el mdico.  Tome los antibiticos o aplqueselos como se lo haya indicado el mdico. No deje de usar el antibitico aunque comience a Actor. Instrucciones generales  Si tiene una pareja sexual mujer, avsele que sufre una infeccin vaginal. Samson Frederic debe concurrir a visitas de control con el mdico. Si tiene una pareja sexual hombre, l no necesita tratamiento.  Evite la actividad sexual hasta que haya finalizado el [place].  Beba suficiente lquido como para Pharmacologist la orina de color amarillo plido.  Mantenga limpia la zona que rodea la vagina y Administrator. ? Lave la zona diariamente con agua tibia. ? Cuando vaya al bao, siempre higiencese desde adelante hacia atrs.  Si est amamantando, hable con su mdico acerca de Regulatory affairs officer.  Cumpla con todas las visitas de seguimiento. Esto es importante. Cmo se previene? Autocuidado  No se haga duchas vaginales.  Higiencese la parte exterior de la vagina solo con agua tibia.  Use ropa interior de algodn o con  revestimiento de algodn.  No use pantalones ni pantis ajustados; en especial, durante el verano. Sexo seguro  Utilice proteccin cuando Retail banker. Esto puede comprender lo siguiente: ? Utilizar condn. ? Utilizar barrera bucal. Se trata de una fina capa de un material hecho de ltex o poliuretano que protege la boca durante el sexo oral.  Limite el nmero de parejas sexuales que tiene. Para prevenir la vaginosis bacteriana, es mejor Applied Materials sexuales solo con IT trainer mongama).  Es importante que usted y su pareja sexual se realicen estudios de deteccin de ITS. Drogas y alcohol  No consuma ningn producto que contenga nicotina o tabaco. Estos productos incluyen cigarrillos, tabaco para Theatre manager y aparatos de vapeo, como los Administrator, Civil Service. Si necesita ayuda para dejar de fumar, consulte al mdico.  No consuma drogas.  No beba alcohol si: ? El mdico le indica que no lo haga. ? Est embarazada, puede estar embarazada o est tratando de quedar embarazada.  Si bebe alcohol: ? Limite la cantidad que consume de 0 a 1 medida por da. ? Est atenta a la cantidad de alcohol que hay en las bebidas que toma. En los [place], una medida equivale a una botella de cerveza de 12oz ( ), un vaso de vino de 5oz ( ) o un vaso de una bebida alcohlica de alta graduacin de 1oz (44ml). Dnde buscar ms informacin  Centers for Disease Control and Prevention (Centros  para Air traffic controller y la Prevencin de Event organiser): FootballExhibition.com.br  American Sexual Health Association Designer, jewellery) (Asociacin Estadounidense de la Salud Sexual): www.ashastd.org  U.S. Department of Health and [employer], Office on [address] (Departamento de Salud y [place] de los [place], [place] de Salud de la Mujer): http://hoffman.com/ Comunquese con un mdico si:  Los sntomas no mejoran, ni siquiera despus del [place].  Tiene ms  secrecin o siente dolor al ConocoPhillips.  Tiene fiebre o escalofros.  Siente dolor en el abdomen o la pelvis.  Siente dolor durante las The St. [employer] Travelers.  Tiene sangrado vaginal entre los periodos Becton, Dickinson and Company. Resumen  La vaginosis bacteriana es una infeccin vaginal que ocurre cuando cambia el equilibrio normal de las bacterias que se encuentran en la vagina. Es el resultado de un crecimiento excesivo de ciertas bacterias.  Esta afeccin aumenta el riesgo de tener infecciones de transmisin sexual (ITS). Recibir tratamiento puede ayudar a reducir First Data Corporation.  El tratamiento es muy importante para las mujeres embarazadas porque esta afeccin puede provocar que los bebs nazcan antes de tiempo (prematuramente) o con bajo peso.  Esta afeccin se trata con antibiticos. Podran administrarse en forma de pastillas, de una crema vaginal o de un medicamento que se coloca dentro de la vagina(vulo vaginal). Esta informacin no tiene Theme park manager el consejo del mdico. Asegrese de hacerle al mdico cualquier pregunta que tenga. Document Revised: 08/14/2019 Document Reviewed: 08/14/2019 Elsevier Patient Education  2021 Elsevier Inc.       Prenatal Care Providers           [facility] @ [facility] - accepts patients without insurance  Phone: [phone]  [facility] [employer] @ [facility]   Phone: [phone]  [facility]  [place]       Phone: [phone]            [facility] @ [place]     Phone: [phone]          [facility] @ [employer]   Phone: [phone]  [facility] @ [facility] - accepts patients without insurance  Phone: [phone]  [facility] @ [facility] Phone: [phone]     [facility] - accepts patients without insurance Phone: [phone]  [facility]  Phone: [phone]  [facility] Phone: [phone]  [facility] Phone: [phone]  [facility] Phone: [phone]  [facility] Phone: [phone]  [facility]  Phone: [phone]        Las medicinas seguras para tomar durante el embarazo  Safe Medications in Pregnancy  Acn:  Benzoyl Peroxide (Perxido de benzolo)  Salicylic Acid (cido saliclico)  Dolor de espalda/Dolor de cabeza:  Tylenol: 2 pastillas de concentracin regular cada 4 horas O 2 pastillas de concentracin fuerte cada 6 horas  Resfriados/Tos/Alergias:  Benadryl (sin alcohol) 25 mg cada 6 horas segn lo necesite Breath Right strips (Tiras para respirar correctamente)  Claritin  Cepacol (pastillas de chupar para la garganta)  Chloraseptic (aerosol para la garganta)  Cold-Eeze- hasta tres veces por da  Cough drops (pastillas de chupar para la tos, sin alcohol)  Flonase (con receta mdica solamente)  Guaifenesin  Mucinex  Robitussin DM (simple solamente, sin alcohol)  Saline nasal spray/drops (Aerosol nasal salino/gotas) Sudafed (pseudoephedrine) y  Actifed * utilizar slo despus de 12 semanas de gestacin y si no tiene la presin arterial alta.  Tylenol Vicks  VapoRub  Zinc  lozenges (pastillas para la garganta)  Zyrtec  Estreimiento:  Colace  Ducolax (supositorios)  Fleet enema (lavado intestinal rectal)  Glycerin (supositorios)  Metamucil  Milk of magnesia (leche de magnesia)  Miralax  Senokot  Smooth Move (t)  Diarrea:  Kaopectate Imodium A-D  *NO tome Pepto-Bismol  Hemorroides:  Anusol  Anusol HC  Preparation H  Tucks  Indigestin:  Tums  Maalox  Mylanta  Zantac  Pepcid  Insomnia:  Benadryl (sin alcohol) 25mg  cada 6 horas segn lo necesite  Tylenol PM  Unisom, no Gelcaps  Calambres en las piernas:  Tums  MagGel Nuseas/Vmitos:  Bonine  Dramamine  Emetrol  Ginger (extracto)  Sea-Bands  Meclizine  Medicina para las nuseas  que puede tomar durante el embarazo: Unisom (doxylamine succinate, pastillas de 25 mg) Tome una pastilla al da al [place]. Si los sntomas no estn adecuadamente controlados, la dosis puede aumentarse hasta una dosis mxima recomendada de [place] al da (1/2 pastilla por la [place], 1/2 pastilla a media tarde y [place] pastilla al [place]). Pastillas de Vitamina B6 de 100mg . Tome [place] veces al da (hasta 200 mg por da).  Erupciones en la piel:  Productos de Aveeno  Benadryl cream (crema o una dosis de 25mg  cada 6 horas segn lo necesite)  Calamine Lotion (locin)  1% cortisone cream (crema de cortisona de 1%)  nfeccin vaginal por hongos (candidiasis):  Gyne-lotrimin 7  Monistat 7   **Si est tomando varias medicinas, por favor revise las etiquetas para los mismos ingredientes [place]. **Tome la medicina segn lo indicado en la etiqueta. **No tome ms de 400 mg de Tylenol en 24 horas. **No tome medicinas que contengan aspirina o ibuprofeno.            Obstetrics: Normal and Problem Pregnancies (7th ed., pp. 102-121). [place], [place]: Elsevier."> Textbook of Family Medicine (9th ed., pp. [phone]). [place]: 04-28-1966 trimestre de [phone] First Trimester of Pregnancy  El primer trimestre de [place] de su ltimo periodo menstrual hasta el final de la semana 12. Es Restaurant manager, fast food desde el mes 1 hasta el mes 3 de [place]. Una semana despus de que un espermatozoide fecunda un vulo, este se implantar en la pared uterina y comenzar a desarrollarse hasta convertirse en un beb. Al final de las 12 Community education officer, se formarn todos los rganos del beb y el beb tendr un tamao de [place] 2 y 3 pulgadas. Durante [place] trimestre, ocurren cambios en el cuerpo Su organismo atraviesa por muchos cambios durante el [place]. Los cambios varan y generalmente vuelven a la normalidad despus del nacimiento del beb. Cambios  fsicos  Usted puede aumentar o bajar de [place].  Las Financial risk analyst pueden empezar a [place] y [place]. El tejido que rodea los pezones (arola) puede tornarse ms oscuro.  Pueden aparecer zonas oscuras o manchas (cloasma o mscara del embarazo) en el rostro.  Tal vez haya cambios en el cabello. Estos pueden incluir engrosamiento, afinamiento y ConAgra Foods textura. Cambios en la salud  Quizs sienta nuseas y es posible que vomite.  Es posible que tenga Government social research officer estomacal.  Comienza a tener dolores de Emergency planning/management officer.  Puede tener estreimiento.  Las Allied Waste Industries y estar sensibles al cepillado y al hilo dental. Otros cambios  Puede cansarse con facilidad.  Puede orinar con mayor frecuencia.  Los perodos menstruales se interrumpirn.  Tal vez no tenga apetito.  Puede sentir un fuerte deseo de consumir ciertos alimentos.  Puede tener cambios en sus emociones de un da para Therapist, artotro.  Tendr sueos ms vvidos y extraos. Siga estas instrucciones en su casa: Medicamentos  Siga las instrucciones del mdico en relacin con el uso de medicamentos. Durante el embarazo, hay medicamentos que pueden tomarse y otros que no. No use ningn medicamento a menos que se los haya indicado el mdico.  Tome vitaminas prenatales que contengan por lo menos 600microgramos (mcg) de cido flico. Comida y bebida  Lleve una dieta saludable que incluya frutas y verduras frescas, cereales integrales, buenas fuentes de protenas como carnes Martinmagras, huevos o tofu, y productos lcteos descremados.  Evite la carne cruda y el [place]o, la [place] y el queso sin Market researcherpasteurizar. Estos portan grmenes que pueden provocar dao tanto a usted como al beb.  Siente nuseas o vomita: ? Ingiera 4 o 5comidas pequeas por Geophysical data processorda en lugar de 3abundantes. ? Intente comer algunas galletitas saladas. ? Beba lquidos Altria Groupentre las comidas, en lugar de [place] Scientifichacerlo durante estas.  Es posible que tenga que tomar estas medidas para  prevenir o tratar el estreimiento: ? Product managerBeber suficiente lquido como para Pharmacologistmantener la orina de color amarillo plido. ? Consumir alimentos ricos en fibra, como frijoles, cereales integrales, y frutas y verduras frescas. ? Limitar el consumo de alimentos ricos en grasa y azcares procesados, como los alimentos fritos o dulces. Actividad  Haga ejercicio solamente como se lo haya indicado el mdico. La mayora de las personas pueden continuar su rutina de ejercicios durante el [place]arazo. Intente realizar como mnimo 30minutos de actividad fsica por lo menos 5das a la semana.  Deje de hacer ejercicio si le aparecen dolor o clicos en la parte baja del vientre o de la espalda.  Evite hacer ejercicio si hace mucho calor o humedad, o si se encuentra a una altitud elevada.  Evite levantar pesos Fortune Brandsexcesivos.  Si lo desea, puede seguir teniendo The St. Paul Travelersrelaciones sexuales, salvo que el mdico le indique lo contrario. Alivio del dolor y del Dentistmalestar  Use un sostn que le brinde buen soporte para Engineer, materialsaliviar el dolor de [place]amas.  Descanse con las piernas elevadas si tiene calambres o dolor de cintura.  Si desarrolla venas abultadas (vrices) en las piernas: ? Use medias de compresin como se lo haya indicado el mdico. ? Eleve los pies durante 15minutos, 3 o 4veces por da. ? Limite el consumo de sal en su dieta. Seguridad  Use el cinturn de seguridad en todo momento mientras conduce o va en auto.  Hable con el mdico si es vctima de Genuine Partsmaltrato verbal o fsico.  Hable con el mdico si se siente triste o tiene pensamientos acerca de [place] Holidayhacerse dao a usted misma. Estilo de vida  No se d baos de inmersin en agua caliente, baos turcos ni saunas.  No se haga duchas vaginales. No use tampones ni toallas higinicas perfumadas.  No use remedios a base de hierbas, alcohol, drogas ilegales ni medicamentos que no estn aprobados por el mdico. Las sustancias qumicas de estos productos pueden daar al  beb.  No consuma ningn producto que contenga nicotina o tabaco, como cigarrillos, cigarrillos electrnicos y tabaco de Theatre managermascar. Si necesita ayuda para dejar de fumar, consulte al mdico.  Evite el contacto con las bandejas sanitarias de los gatos y la tierra que estos animales usan. Estos elementos contienen bacterias que pueden causar defectos congnitos al beb y la posible prdida del beb en gestacin (feto) debido a un aborto espontneo o muerte fetal. Instrucciones generales  Durante las visitas prenatales de  rutina del Engineer, maintenance trimestre, Psychologist, sport and exercise un examen fsico, Education officer, environmental las pruebas necesarias y le preguntar cmo Merchant navy officer las cosas. Cumpla con todas las visitas de seguimiento. Esto es importante.  Pida ayuda si tiene necesidades nutricionales o de asesoramiento Academic librarian. El mdico puede aconsejarla o derivarla a especialistas para que la ayuden con diferentes necesidades.  Programe una cita con el dentista. En su casa, lvese los dientes con un cepillo dental suave. Psese el hilo dental suavemente.  Escriba sus preguntas. Llvelas cuando concurra a las visitas prenatales. Dnde buscar ms informacin  American Pregnancy Association (Asociacin Americana del Embarazo): americanpregnancy.org  [employer] Corporation of Obstetricians and Gynecologists (Colegio Estadounidense de Obstetras y [place]abb): EmploymentAssurance.cz?  Office on Pitney [employer]es (Oficina para la Salud de la Mujer): [email or web address] Comunquese con un mdico si tiene:  Research scientist (life sciences).  Grant Ruts.  Clicos leves, presin en la pelvis o dolor persistente en el abdomen.  Nuseas, vmitos o diarrea que duran 24 horas o ms.  Una secrecin vaginal con mal olor.  Dolor al Beatrix Shipper.  Una enfermedad contagiosa, como varicela, sarampin, virus de [place], VIH o hepatitis. Solicite ayuda inmediatamente si:  Tiene manchado o sangrado de la vagina.  Tiene dolor intenso o clicos en el  abdomen.  Siente que le falta el aire o dolor en el pecho.  Sufre cualquier tipo de traumatismo, por ejemplo, debido a una cada o un accidente automovilstico.  Dolor, hinchazn o enrojecimiento nuevos en un brazo o una pierna, o un aumento de alguno de estos sntomas. Resumen  Financial risk analyst trimestre del Community education officer de su ltimo periodo menstrual hasta el final de la semana 12 (meses 1 al 3).  Hacer 4 o 5 comidas pequeas al Geophysical data processor de 3 comidas grandes tambin puede aliviar las nuseas y los vmitos.  No consuma ningn producto que contenga nicotina o tabaco, como cigarrillos, cigarrillos electrnicos y tabaco de Theatre manager. Si necesita ayuda para dejar de fumar, consulte al mdico.  Cumpla con todas las visitas de seguimiento. Esto es importante. Esta informacin no tiene Theme park manager el consejo del mdico. Asegrese de hacerle al mdico cualquier pregunta que tenga. Document Revised: 07/15/2019 Document Reviewed: 07/15/2019 Elsevier Patient Education  2021 Arvin[employer]itor.

## 2020-05-19 NOTE — MAU Note (Signed)
Started having pain in lower abd. Getting a little  worse- feels like contractions. Is preg,preg confirmed [place]'.  Came end of March. No bleeding.  Chills- come with pain; and vomiting- for a month

## 2020-05-19 NOTE — MAU Note (Signed)
Patient unable to void at this time, provider notified

## 2020-05-20 LAB — CULTURE, OB URINE: Culture: NO GROWTH

## 2020-05-20 LAB — GC/CHLAMYDIA PROBE AMP (~~LOC~~) NOT AT ARMC
Chlamydia: NEGATIVE
Comment: NEGATIVE
Comment: NORMAL
Neisseria Gonorrhea: NEGATIVE

## 2022-06-02 ENCOUNTER — Encounter (HOSPITAL_COMMUNITY)
Admission: EM | Disposition: A | Discharge status: Home / Self Care | Payer: Self-pay | Source: Home / Self Care | Attending: Emergency Medicine

## 2022-06-02 ENCOUNTER — Ambulatory Visit (HOSPITAL_COMMUNITY)
Admission: EM | Admit: 2022-06-02 | Discharge: 2022-06-03 | Disposition: A | Discharge status: Home / Self Care | Payer: Medicaid Other | Attending: Emergency Medicine | Admitting: Emergency Medicine

## 2022-06-02 ENCOUNTER — Other Ambulatory Visit: Payer: Self-pay

## 2022-06-02 ENCOUNTER — Emergency Department (HOSPITAL_COMMUNITY): Payer: Medicaid Other | Admitting: Certified Registered"

## 2022-06-02 ENCOUNTER — Emergency Department (EMERGENCY_DEPARTMENT_HOSPITAL): Payer: Medicaid Other | Admitting: Certified Registered"

## 2022-06-02 ENCOUNTER — Emergency Department (HOSPITAL_COMMUNITY): Payer: Medicaid Other

## 2022-06-02 DIAGNOSIS — O00101 Right tubal pregnancy without intrauterine pregnancy: Secondary | ICD-10-CM | POA: Insufficient documentation

## 2022-06-02 DIAGNOSIS — N83201 Unspecified ovarian cyst, right side: Secondary | ICD-10-CM | POA: Insufficient documentation

## 2022-06-02 DIAGNOSIS — K661 Hemoperitoneum: Secondary | ICD-10-CM

## 2022-06-02 DIAGNOSIS — Z3A01 Less than 8 weeks gestation of pregnancy: Secondary | ICD-10-CM | POA: Diagnosis not present

## 2022-06-02 DIAGNOSIS — R102 Pelvic and perineal pain: Secondary | ICD-10-CM

## 2022-06-02 DIAGNOSIS — R109 Unspecified abdominal pain: Secondary | ICD-10-CM

## 2022-06-02 HISTORY — PX: LAPAROSCOPY: SHX197

## 2022-06-02 LAB — CBC WITH DIFFERENTIAL/PLATELET
Abs Immature Granulocytes: 0.06 10*3/uL (ref 0.00–0.07)
Basophils Absolute: 0 10*3/uL (ref 0.0–0.1)
Basophils Relative: 0 %
Eosinophils Absolute: 0.1 10*3/uL (ref 0.0–0.5)
Eosinophils Relative: 1 %
HCT: 41.1 % (ref 36.0–46.0)
Hemoglobin: 13.1 g/dL (ref 12.0–15.0)
Immature Granulocytes: 0 %
Lymphocytes Relative: 8 %
Lymphs Abs: 1.4 10*3/uL (ref 0.7–4.0)
MCH: 29.4 pg (ref 26.0–34.0)
MCHC: 31.9 g/dL (ref 30.0–36.0)
MCV: 92.2 fL (ref 80.0–100.0)
Monocytes Absolute: 0.5 10*3/uL (ref 0.1–1.0)
Monocytes Relative: 3 %
Neutro Abs: 14.6 10*3/uL — ABNORMAL HIGH (ref 1.7–7.7)
Neutrophils Relative %: 88 %
Platelets: 201 10*3/uL (ref 150–400)
RBC: 4.46 MIL/uL (ref 3.87–5.11)
RDW: 13.5 % (ref 11.5–15.5)
WBC: 16.7 10*3/uL — ABNORMAL HIGH (ref 4.0–10.5)
nRBC: 0 % (ref 0.0–0.2)

## 2022-06-02 LAB — COMPREHENSIVE METABOLIC PANEL
ALT: 31 U/L (ref 0–44)
AST: 27 U/L (ref 15–41)
Albumin: 3.7 g/dL (ref 3.5–5.0)
Alkaline Phosphatase: 54 U/L (ref 38–126)
Anion gap: 10 (ref 5–15)
BUN: 5 mg/dL — ABNORMAL LOW (ref 6–20)
CO2: 18 mmol/L — ABNORMAL LOW (ref 22–32)
Calcium: 8.8 mg/dL — ABNORMAL LOW (ref 8.9–10.3)
Chloride: 108 mmol/L (ref 98–111)
Creatinine, Ser: 0.61 mg/dL (ref 0.44–1.00)
GFR, Estimated: >60 mL/min (ref >60)
Glucose, Bld: 103 mg/dL — ABNORMAL HIGH (ref 70–99)
Potassium: 4.2 mmol/L (ref 3.5–5.1)
Sodium: 136 mmol/L (ref 135–145)
Total Bilirubin: 0.5 mg/dL (ref 0.3–1.2)
Total Protein: 6.6 g/dL (ref 6.5–8.1)

## 2022-06-02 LAB — TYPE AND SCREEN
ABO/RH(D): A POS
Antibody Screen: NEGATIVE

## 2022-06-02 LAB — I-STAT BETA HCG BLOOD, ED (MC, WL, AP ONLY): I-stat hCG, quantitative: 684.8 m[IU]/mL — ABNORMAL HIGH (ref <5)

## 2022-06-02 LAB — LIPASE, BLOOD: Lipase: 26 U/L (ref 11–51)

## 2022-06-02 SURGERY — LAPAROSCOPY, DIAGNOSTIC
Anesthesia: General | Site: Abdomen

## 2022-06-02 MED ORDER — ACETAMINOPHEN 10 MG/ML IV SOLN
INTRAVENOUS | Status: AC
  Filled 2022-06-02: qty 100

## 2022-06-02 MED ORDER — ACETAMINOPHEN 10 MG/ML IV SOLN
INTRAVENOUS | Status: DC | PRN
  Administered 2022-06-02: 1000 mg via INTRAVENOUS

## 2022-06-02 MED ORDER — KETOROLAC TROMETHAMINE 30 MG/ML IJ SOLN
INTRAMUSCULAR | Status: DC | PRN
  Administered 2022-06-02: 30 mg via INTRAVENOUS

## 2022-06-02 MED ORDER — LACTATED RINGERS IV SOLN: INTRAVENOUS | Status: DC | PRN

## 2022-06-02 MED ORDER — SUCCINYLCHOLINE CHLORIDE 200 MG/10ML IV SOSY
PREFILLED_SYRINGE | INTRAVENOUS | Status: DC | PRN
  Administered 2022-06-02: 140 mg via INTRAVENOUS

## 2022-06-02 MED ORDER — DEXAMETHASONE SODIUM PHOSPHATE 10 MG/ML IJ SOLN
INTRAMUSCULAR | Status: DC | PRN
  Administered 2022-06-02: 10 mg via INTRAVENOUS

## 2022-06-02 MED ORDER — LIDOCAINE 2% (20 MG/ML) 5 ML SYRINGE
INTRAMUSCULAR | Status: DC | PRN
  Administered 2022-06-02: 100 mg via INTRAVENOUS

## 2022-06-02 MED ORDER — AMISULPRIDE (ANTIEMETIC) 5 MG/2ML IV SOLN
10.0000 mg | Freq: Once | INTRAVENOUS | Status: AC | PRN
  Administered 2022-06-03: 10 mg via INTRAVENOUS

## 2022-06-02 MED ORDER — FENTANYL CITRATE (PF) 250 MCG/5ML IJ SOLN
INTRAMUSCULAR | Status: AC
  Filled 2022-06-02: qty 5

## 2022-06-02 MED ORDER — SODIUM CHLORIDE 0.9 % IR SOLN
Status: DC | PRN
  Administered 2022-06-02: 1000 mL

## 2022-06-02 MED ORDER — SUGAMMADEX SODIUM 200 MG/2ML IV SOLN
INTRAVENOUS | Status: DC | PRN
  Administered 2022-06-02: 200 mg via INTRAVENOUS

## 2022-06-02 MED ORDER — PROPOFOL 10 MG/ML IV BOLUS
INTRAVENOUS | Status: AC
  Filled 2022-06-02: qty 20

## 2022-06-02 MED ORDER — 0.9 % SODIUM CHLORIDE (POUR BTL) OPTIME
TOPICAL | Status: DC | PRN
  Administered 2022-06-02: 1000 mL

## 2022-06-02 MED ORDER — MORPHINE SULFATE (PF) 4 MG/ML IV SOLN
4.0000 mg | Freq: Once | INTRAVENOUS | Status: AC
  Administered 2022-06-02: 4 mg via INTRAVENOUS
  Filled 2022-06-02: qty 1

## 2022-06-02 MED ORDER — MIDAZOLAM HCL 2 MG/2ML IJ SOLN
INTRAMUSCULAR | Status: AC
  Filled 2022-06-02: qty 2

## 2022-06-02 MED ORDER — MIDAZOLAM HCL 2 MG/2ML IJ SOLN
INTRAMUSCULAR | Status: DC | PRN
  Administered 2022-06-02: 2 mg via INTRAVENOUS

## 2022-06-02 MED ORDER — PROMETHAZINE HCL 25 MG/ML IJ SOLN: 6.2500 mg | INTRAMUSCULAR | Status: DC | PRN

## 2022-06-02 MED ORDER — ROCURONIUM BROMIDE 10 MG/ML (PF) SYRINGE
PREFILLED_SYRINGE | INTRAVENOUS | Status: DC | PRN
  Administered 2022-06-02: 5 mg via INTRAVENOUS
  Administered 2022-06-02: 30 mg via INTRAVENOUS
  Administered 2022-06-02: 10 mg via INTRAVENOUS

## 2022-06-02 MED ORDER — ONDANSETRON HCL 4 MG/2ML IJ SOLN
INTRAMUSCULAR | Status: DC | PRN
  Administered 2022-06-02: 4 mg via INTRAVENOUS

## 2022-06-02 MED ORDER — FENTANYL CITRATE (PF) 100 MCG/2ML IJ SOLN: 25.0000 ug | INTRAMUSCULAR | Status: DC | PRN

## 2022-06-02 MED ORDER — PROPOFOL 10 MG/ML IV BOLUS
INTRAVENOUS | Status: DC | PRN
  Administered 2022-06-02: 120 mg via INTRAVENOUS

## 2022-06-02 MED ORDER — FENTANYL CITRATE (PF) 250 MCG/5ML IJ SOLN
INTRAMUSCULAR | Status: DC | PRN
  Administered 2022-06-02 (×2): 50 ug via INTRAVENOUS
  Administered 2022-06-02: 100 ug via INTRAVENOUS
  Administered 2022-06-02: 50 ug via INTRAVENOUS

## 2022-06-02 SURGICAL SUPPLY — 40 items
ADH SKN CLS APL DERMABOND .7 (GAUZE/BANDAGES/DRESSINGS) ×1
APL SWBSTK 6 STRL LF DISP (MISCELLANEOUS) ×2
APPLICATOR COTTON TIP 6 STRL (MISCELLANEOUS) IMPLANT
APPLICATOR COTTON TIP 6IN STRL (MISCELLANEOUS) ×2 IMPLANT
BAG COUNTER SPONGE SURGICOUNT (BAG) ×1 IMPLANT
BAG SPNG CNTER NS LX DISP (BAG) ×1
CABLE HIGH FREQUENCY MONO STRZ (ELECTRODE) IMPLANT
CNTNR URN SCR LID CUP LEK RST (MISCELLANEOUS) IMPLANT
CONT SPEC 4OZ STRL OR WHT (MISCELLANEOUS) ×1
DERMABOND ADVANCED .7 DNX12 (GAUZE/BANDAGES/DRESSINGS) IMPLANT
DRSG OPSITE POSTOP 3X4 (GAUZE/BANDAGES/DRESSINGS) IMPLANT
DURAPREP 26ML APPLICATOR (WOUND CARE) ×1 IMPLANT
GLOVE BIO SURGEON STRL SZ 6.5 (GLOVE) ×1 IMPLANT
GLOVE BIOGEL PI IND STRL 7.0 (GLOVE) ×4 IMPLANT
GOWN STRL REUS W/ TWL LRG LVL3 (GOWN DISPOSABLE) ×2 IMPLANT
GOWN STRL REUS W/TWL LRG LVL3 (GOWN DISPOSABLE) ×2
IRRIG SUCT STRYKERFLOW 2 WTIP (MISCELLANEOUS) ×1
IRRIGATION SUCT STRKRFLW 2 WTP (MISCELLANEOUS) IMPLANT
KIT PINK PAD W/HEAD ARE REST (MISCELLANEOUS) ×1 IMPLANT
KIT PINK PAD W/HEAD ARM REST (MISCELLANEOUS) IMPLANT
KIT TURNOVER KIT B (KITS) ×1 IMPLANT
NDL INSUFFLATION 14GA 120MM (NEEDLE) ×1 IMPLANT
NEEDLE INSUFFLATION 14GA 120MM (NEEDLE) ×1 IMPLANT
NS IRRIG 1000ML POUR BTL (IV SOLUTION) ×1 IMPLANT
PACK LAPAROSCOPY BASIN (CUSTOM PROCEDURE TRAY) ×1 IMPLANT
PAD OB MATERNITY 4.3X12.25 (PERSONAL CARE ITEMS) IMPLANT
PROTECTOR NERVE ULNAR (MISCELLANEOUS) ×2 IMPLANT
SET TUBE SMOKE EVAC HIGH FLOW (TUBING) ×1 IMPLANT
SHEARS HARMONIC ACE PLUS 36CM (ENDOMECHANICALS) IMPLANT
SLEEVE Z-THREAD 5X100MM (TROCAR) ×1 IMPLANT
STRIP CLOSURE SKIN 1/2X4 (GAUZE/BANDAGES/DRESSINGS) IMPLANT
SUT VICRYL 0 UR6 27IN ABS (SUTURE) ×1 IMPLANT
SUT VICRYL 4-0 PS2 18IN ABS (SUTURE) ×1 IMPLANT
SYS BAG RETRIEVAL 10MM (BASKET) ×1
SYSTEM BAG RETRIEVAL 10MM (BASKET) IMPLANT
TOWEL GREEN STERILE FF (TOWEL DISPOSABLE) ×2 IMPLANT
TRAY FOLEY W/BAG SLVR 14FR (SET/KITS/TRAYS/PACK) ×1 IMPLANT
TROCAR XCEL DIL TIP R 11M (ENDOMECHANICALS) ×1 IMPLANT
TROCAR XCEL NON-BLD 5MMX100MML (ENDOMECHANICALS) ×1 IMPLANT
WARMER LAPAROSCOPE (MISCELLANEOUS) ×1 IMPLANT

## 2022-06-02 NOTE — ED Triage Notes (Signed)
Pt BIB [facility] from home with c/o abdominal pain. Pt also states she has had blood in her urine.   EMS VS BP 122/96 P 66 R 20 O2 99 RA CBG 108

## 2022-06-02 NOTE — H&P (Signed)
Joyce Ryan is an 32 y.o. female. [id number] LMP 3-4 weeks ago no contraceptive method. She had a miscarriage at 3 mo gestation about 3 months ago and sought no care. She began bleeding and having low abdominal pain at 1400 today and pain is mostly RLG. No fever or nausea. She last ate at 1300. She had a positive HCG in the ED and no IUP on [place] with free pelvic fluid.    Pertinent Gynecological History: Menses: flow is moderate Bleeding: started today Contraception: none Blood transfusions: none Sexually transmitted diseases: no past history Previous GYN Procedures:  no   Last pap:  unknown     Menstrual History:  No LMP recorded.    No past medical history on file.  Past Surgical History:  Procedure Laterality Date   CESAREAN SECTION      No family history on file.  Social History:  reports that she has been smoking. She has never used smokeless tobacco. She reports current alcohol use. She reports that she does not use drugs.  Allergies: No Known Allergies  (Not in a hospital admission)   Review of Systems  Constitutional: Negative.   Respiratory: Negative.    Cardiovascular: Negative.   Gastrointestinal:  Positive for abdominal pain.  Genitourinary:  Positive for pelvic pain and vaginal bleeding.    Blood pressure 113/68, pulse 70, temperature 98.2 F (36.8 C), temperature source Oral, resp. rate 18, height 5\' 4"  (1.626 m), weight 74.8 kg, SpO2 100 %, unknown if currently breastfeeding. Physical Exam Vitals and nursing note reviewed.  Constitutional:      Appearance: She is well-developed. She is not ill-appearing.  HENT:     Head: Normocephalic and atraumatic.  Cardiovascular:     Rate and Rhythm: Normal rate.  Pulmonary:     Effort: Pulmonary effort is normal.  Abdominal:     General: Abdomen is flat. There is no distension.     Palpations: Abdomen is soft.     Tenderness: There is abdominal tenderness in the right lower quadrant. There is  guarding.  Skin:    General: Skin is warm and dry.  Neurological:     General: No focal deficit present.     Mental Status: She is alert.  Psychiatric:        Mood and Affect: Mood normal.        Behavior: Behavior normal.     Results for orders placed or performed during the hospital encounter of 06/02/22 (from the past 24 hour(s))  CBC with Differential     Status: Abnormal   Collection Time: 06/02/22  6:07 PM  Result Value Ref Range   WBC 16.7 (H) 4.0 - 10.5 K/uL   RBC 4.46 3.87 - 5.11 MIL/uL   Hemoglobin 13.1 12.0 - 15.0 g/dL   HCT 14.7 82.9 - 56.2 %   MCV 92.2 80.0 - 100.0 fL   MCH 29.4 26.0 - 34.0 pg   MCHC 31.9 30.0 - 36.0 g/dL   RDW 13.0 86.5 - 78.4 %   Platelets 201 150 - 400 K/uL   nRBC 0.0 0.0 - 0.2 %   Neutrophils Relative % 88 %   Neutro Abs 14.6 (H) 1.7 - 7.7 K/uL   Lymphocytes Relative 8 %   Lymphs Abs 1.4 0.7 - 4.0 K/uL   Monocytes Relative 3 %   Monocytes Absolute 0.5 0.1 - 1.0 K/uL   Eosinophils Relative 1 %   Eosinophils Absolute 0.1 0.0 - 0.5 K/uL   Basophils Relative 0 %  Basophils Absolute 0.0 0.0 - 0.1 K/uL   Immature Granulocytes 0 %   Abs Immature Granulocytes 0.06 0.00 - 0.07 K/uL  Lipase, blood     Status: None   Collection Time: 06/02/22  6:07 PM  Result Value Ref Range   Lipase 26 11 - 51 U/L  Comprehensive metabolic panel     Status: Abnormal   Collection Time: 06/02/22  6:07 PM  Result Value Ref Range   Sodium 136 135 - 145 mmol/L   Potassium 4.2 3.5 - 5.1 mmol/L   Chloride 108 98 - 111 mmol/L   CO2 18 (L) 22 - 32 mmol/L   Glucose, Bld 103 (H) 70 - 99 mg/dL   BUN 5 (L) 6 - 20 mg/dL   Creatinine, Ser 8.11 0.44 - 1.00 mg/dL   Calcium 8.8 (L) 8.9 - 10.3 mg/dL   Total Protein 6.6 6.5 - 8.1 g/dL   Albumin 3.7 3.5 - 5.0 g/dL   AST 27 15 - 41 U/L   ALT 31 0 - 44 U/L   Alkaline Phosphatase 54 38 - 126 U/L   Total Bilirubin 0.5 0.3 - 1.2 mg/dL   GFR, Estimated >91 >47 mL/min   Anion gap 10 5 - 15  I-Stat beta hCG blood, ED ([facility], [facility],  [facility] only)     Status: Abnormal   Collection Time: 06/02/22  6:33 PM  Result Value Ref Range   I-stat hCG, quantitative 684.8 (H) <5 mIU/mL   Comment 3            US PELVIC COMPLETE W TRANSVAGINAL AND TORSION R/O  Result Date: 06/02/2022 CLINICAL DATA:  Abdominal pain.  Beta HCG 684.  LMP 05/12/2022. EXAM: TRANSABDOMINAL AND TRANSVAGINAL ULTRASOUND OF PELVIS DOPPLER ULTRASOUND OF OVARIES TECHNIQUE: Both transabdominal and transvaginal ultrasound examinations of the pelvis were performed. Transabdominal technique was performed for global imaging of the pelvis including uterus, ovaries, adnexal regions, and pelvic cul-de-sac. It was necessary to proceed with endovaginal exam following the transabdominal exam to visualize the uterus and adnexa. Color and duplex Doppler ultrasound was utilized to evaluate blood flow to the ovaries. COMPARISON:  Ultrasound 02/21/2018 FINDINGS: Uterus Measurements: 9.3 x 4.4 x 4.5 cm = volume: 96.0 mL. No fibroids or other mass visualized. Endometrium Thickness: 11 mm.  No focal abnormality visualized. Right ovary Measurements: 4.0 x 3.1 x 3.1 cm = volume: 20 mL. There is a complex cystic lesion in the near the right adnexa measuring 2.9 x 2.7 cm. This is indeterminate and could represent a normal follicle, hemorrhagic cyst. Ectopic pregnancy is not excluded given the patient's extreme pain and moderate free fluid in the pelvis and positive beta HCG. Left ovary Measurements: 3.5 x 1.9 x 2.4 cm = volume: 8.2 mL. 1.3 cm cystic lesion in the left ovary favored to represent a corpus luteum cyst. Pulsed Doppler evaluation of both ovaries demonstrates normal low-resistance arterial and venous waveforms. Other findings Patient's excruciating pain noted by the sonographer. Moderate predominantly anechoic free fluid in the pelvic cul-de-sac with a few low-level internal echoes. IMPRESSION: 1. No visualized intrauterine gestational sac. In the setting of positive pregnancy test, this reflects  a pregnancy of unknown location. Differential considerations include early normal IUP, abnormal IUP, or nonvisualized ectopic pregnancy. Differentiation is achieved with serial beta HCG supplemented by repeat sonography as clinically warranted. 2. Complex cystic lesion in the right adnexa measuring 2.9 cm. This is indeterminate and could represent a normal follicle or hemorrhagic cyst. Ectopic pregnancy is not excluded in the  setting of positive beta HCG and moderate free fluid in the pelvis. 3. Moderate free fluid in the pelvic cul-de-sac with a few low-level internal echoes. 4. No evidence of ovarian torsion. Critical Value/emergent results were called by telephone at the time of interpretation on 06/02/2022 at 8:06 pm to provider [facility] , who verbally acknowledged these results. Electronically Signed   By: Minerva Fester M.D.   On: 06/02/2022 20:08   US Abdomen Limited RUQ (LIVER/GB)  Result Date: 06/02/2022 CLINICAL DATA:  Right upper quadrant pain EXAM: ULTRASOUND ABDOMEN LIMITED RIGHT UPPER QUADRANT COMPARISON:  None Available. FINDINGS: Gallbladder: No gallstones or wall thickening visualized. No sonographic Murphy sign noted by sonographer. Common bile duct: Diameter: 3.1 mm Liver: No focal lesion identified. Within normal limits in parenchymal echogenicity. Portal vein is patent on color Doppler imaging with normal direction of blood flow towards the liver. Other: None. IMPRESSION: No acute abnormality noted. Electronically Signed   By: Alcide Clever M.D.   On: 06/02/2022 19:53    Assessment/Plan: Suspected ruptured ectopic pregnancy based on [place] and abdominal tenderness. She consents to laparoscopy and removal of ectopic pregnancy possible salpingectomy.  The risks of surgery were discussed in detail with the patient including but not limited to: bleeding which may require transfusion or reoperation; infection which may require prolonged hospitalization or re-hospitalization and antibiotic  therapy; injury to bowel, bladder, ureters and major vessels or other surrounding organs which may lead to other procedures; formation of adhesions; need for additional procedures including laparotomy or subsequent procedures secondary to intraoperative injury or abnormal pathology; thromboembolic phenomenon; incisional problems and other postoperative or anesthesia complications.  Patient was told that the likelihood that her condition and symptoms will be treated effectively with this surgical management was very high; the postoperative expectations were also discussed in detail. The patient also understands the alternative treatment options which were discussed in full. All questions were answered.    Scheryl Darter 06/02/2022, 9:06 PM

## 2022-06-02 NOTE — ED Provider Notes (Addendum)
[place] EMERGENCY DEPARTMENT AT [facility] Provider Note   CSN: [id number] Arrival date & time: 06/02/22  1735     History Chief Complaint  Patient presents with   Abdominal Pain    HPI Joyce Ryan is a 32 y.o. female presenting for chief complaint of abdominal pain.  States it was severe and started earlier today.  Patient's recorded medical, surgical, social, medication list and allergies were reviewed in the Snapshot window as part of the initial history.   Review of Systems   Review of Systems  Constitutional:  Negative for chills and fever.  HENT:  Negative for ear pain and sore throat.   Eyes:  Negative for pain and visual disturbance.  Respiratory:  Negative for cough and shortness of breath.   Cardiovascular:  Negative for chest pain and palpitations.  Gastrointestinal:  Positive for abdominal distention and abdominal pain. Negative for vomiting.  Genitourinary:  Negative for dysuria and hematuria.  Musculoskeletal:  Negative for arthralgias and back pain.  Skin:  Negative for color change and rash.  Neurological:  Negative for seizures and syncope.  All other systems reviewed and are negative.   Physical Exam Updated Vital Signs BP 110/71 (BP Location: Left Arm)   Pulse 68   Temp (!) (P) 97.5 F (36.4 C)   Resp 18   Ht 5\' 4"  (1.626 m)   Wt 74.8 kg   SpO2 100%   BMI 28.32 kg/m  Physical Exam Vitals and nursing note reviewed.  Constitutional:      General: She is not in acute distress.    Appearance: She is well-developed.  HENT:     Head: Normocephalic and atraumatic.  Eyes:     Conjunctiva/sclera: Conjunctivae normal.  Cardiovascular:     Rate and Rhythm: Normal rate and regular rhythm.     Heart sounds: No murmur heard. Pulmonary:     Effort: Pulmonary effort is normal. No respiratory distress.     Breath sounds: Normal breath sounds.  Abdominal:     General: There is no distension.     Palpations: Abdomen is soft.      Tenderness: There is no abdominal tenderness. There is no right CVA tenderness or left CVA tenderness.  Musculoskeletal:        General: No swelling or tenderness. Normal range of motion.     Cervical back: Neck supple.  Skin:    General: Skin is warm and dry.  Neurological:     General: No focal deficit present.     Mental Status: She is alert and oriented to person, place, and time. Mental status is at baseline.     Cranial Nerves: No cranial nerve deficit.      ED Course/ Medical Decision Making/ A&P    Procedures .Critical Care  Performed by: Glyn Ade, MD Authorized by: Glyn Ade, MD   Critical care provider statement:    Critical care time (minutes):  30   Critical care was time spent personally by me on the following activities:  Development of treatment plan with patient or surrogate, discussions with consultants, evaluation of patient's response to treatment, examination of patient, ordering and review of laboratory studies, ordering and review of radiographic studies, ordering and performing treatments and interventions, pulse oximetry, re-evaluation of patient's condition and review of old charts   Care discussed with: admitting provider      Medications Ordered in ED Medications  fentaNYL (SUBLIMAZE) injection 25-50 mcg (has no administration in time range)  promethazine (  PHENERGAN) injection 6.25-12.5 mg (has no administration in time range)  amisulpride (BARHEMSYS) injection 10 mg (has no administration in time range)  morphine (PF) 4 MG/ML injection 4 mg (4 mg Intravenous Given 06/02/22 1853)   MDM: I was emergently called to patient's bedside.  She is having severe abdominal pain she has been to soon as possible. Hemodynamically stable fortunately.  However severe abdominal pain.  Labs ordered ultrasounds of the pelvis and right upper quadrant ordered due to severity of symptoms.Escalating pain medications ordered as needed  Reassessment: I was  called by ultrasound with a positive finding of potential ruptured ectopic pregnancy.  Obstetrics was emergently consulted patient was arranged for operative intervention. Disposition:   Based on the above findings, I believe this patient is stable for admission.    Patient/family educated about specific findings on our evaluation and explained exact reasons for admission.  Patient/family educated about clinical situation and time was allowed to answer questions.   Admission team communicated with and agreed with need for admission. Patient admitted. Patient ready to move at this time.     Emergency Department Medication Summary:   Medications  fentaNYL (SUBLIMAZE) injection 25-50 mcg (has no administration in time range)  promethazine (PHENERGAN) injection 6.25-12.5 mg (has no administration in time range)  amisulpride (BARHEMSYS) injection 10 mg (has no administration in time range)  morphine (PF) 4 MG/ML injection 4 mg (4 mg Intravenous Given 06/02/22 1853)       Clinical Impression:  1. Abdominal pain, unspecified abdominal location   2. Pelvic pain in female      Admit   Final Clinical Impression(s) / ED Diagnoses Final diagnoses:  Abdominal pain, unspecified abdominal location  Pelvic pain in female    Rx / DC Orders ED Discharge Orders     None         Glyn Ade, MD 06/03/22 1914    Glyn Ade, MD 06/03/22 7829

## 2022-06-02 NOTE — Anesthesia Preprocedure Evaluation (Addendum)
Anesthesia Evaluation  Patient identified by MRN, date of birth, ID band Patient awake    Reviewed: Allergy & Precautions, NPO status , Patient's Chart, lab work & pertinent test results  History of Anesthesia Complications Negative for: history of anesthetic complications  Airway Mallampati: II  TM Distance: >3 FB Neck ROM: Full    Dental no notable dental hx. (+) Dental Advisory Given   Pulmonary neg pulmonary ROS   Pulmonary exam normal        Cardiovascular negative cardio ROS Normal cardiovascular exam     Neuro/Psych negative neurological ROS     GI/Hepatic negative GI ROS, Neg liver ROS,,,  Endo/Other  negative endocrine ROS    Renal/GU negative Renal ROS     Musculoskeletal negative musculoskeletal ROS (+)    Abdominal   Peds  Hematology negative hematology ROS (+)   Anesthesia Other Findings   Reproductive/Obstetrics (+) Pregnancy                             Anesthesia Physical Anesthesia Plan  ASA: 2 and emergent  Anesthesia Plan: General   Post-op Pain Management: Toradol IV (intra-op)* and Ofirmev IV (intra-op)*   Induction: Intravenous  PONV Risk Score and Plan: 4 or greater and Ondansetron, Dexamethasone, Midazolam and Scopolamine patch - Pre-op  Airway Management Planned: Oral ETT  Additional Equipment:   Intra-op Plan:   Post-operative Plan: Extubation in OR  Informed Consent: I have reviewed the patients History and Physical, chart, labs and discussed the procedure including the risks, benefits and alternatives for the proposed anesthesia with the patient or authorized representative who has indicated his/her understanding and acceptance.     Dental advisory given  Plan Discussed with: Anesthesiologist, CRNA and Surgeon  Anesthesia Plan Comments:        Anesthesia Quick Evaluation

## 2022-06-02 NOTE — ED Notes (Signed)
Pt gone to ultrasound

## 2022-06-02 NOTE — Anesthesia Procedure Notes (Signed)
Procedure Name: Intubation Date/Time: 06/02/2022 10:26 PM  Performed by: Macie Burows, CRNAPre-anesthesia Checklist: Patient identified, Emergency Drugs available, Suction available and Patient being monitored Patient Re-evaluated:Patient Re-evaluated prior to induction Oxygen Delivery Method: Circle system utilized Preoxygenation: Pre-oxygenation with 100% oxygen Induction Type: IV induction Ventilation: Mask ventilation without difficulty Laryngoscope Size: Mac and 4 Grade View: Grade III Tube type: Oral Tube size: 7.0 mm Number of attempts: 1 Airway Equipment and Method: Stylet Placement Confirmation: ETT inserted through vocal cords under direct vision, positive ETCO2 and breath sounds checked- equal and bilateral Secured at: 23 cm Tube secured with: Tape Dental Injury: Teeth and Oropharynx as per pre-operative assessment

## 2022-06-03 ENCOUNTER — Encounter (HOSPITAL_COMMUNITY): Payer: Self-pay | Admitting: Obstetrics & Gynecology

## 2022-06-03 DIAGNOSIS — O00101 Right tubal pregnancy without intrauterine pregnancy: Secondary | ICD-10-CM | POA: Diagnosis not present

## 2022-06-03 DIAGNOSIS — Z3A01 Less than 8 weeks gestation of pregnancy: Secondary | ICD-10-CM

## 2022-06-03 MED ORDER — OXYCODONE-ACETAMINOPHEN 5-325 MG PO TABS: 1.0000 | ORAL_TABLET | Freq: Four times a day (QID) | ORAL | 0 refills | Status: DC | PRN

## 2022-06-03 MED ORDER — IBUPROFEN 600 MG PO TABS: 600.0000 mg | ORAL_TABLET | Freq: Four times a day (QID) | ORAL | 2 refills | Status: DC | PRN

## 2022-06-03 MED ORDER — AMISULPRIDE (ANTIEMETIC) 5 MG/2ML IV SOLN
INTRAVENOUS | Status: AC
  Filled 2022-06-03: qty 4

## 2022-06-03 NOTE — Anesthesia Postprocedure Evaluation (Signed)
Anesthesia Post Note  Patient: Joyce Ryan  Procedure(s) Performed: LAPAROSCOPY DIAGNOSTIC FOR ECTOPIC PREGNANCY, RIGHT SALPINGECTOMY AND RIGHT OOPHORECTOMY (Abdomen)     Patient location during evaluation: PACU Anesthesia Type: General Level of consciousness: sedated Pain management: pain level controlled Vital Signs Assessment: post-procedure vital signs reviewed and stable Respiratory status: spontaneous breathing and respiratory function stable Cardiovascular status: stable Postop Assessment: no apparent nausea or vomiting Anesthetic complications: no  No notable events documented.  Last Vitals:  Vitals:   06/03/22 0015 06/03/22 0030  BP: 121/83 129/87  Pulse: 84 77  Resp: (!) 24 (!) 22  Temp:  36.9 C  SpO2: 94% 93%    Last Pain:  Vitals:   06/03/22 0030  TempSrc:   PainSc: 0-No pain                 Lissandra Keil DANIEL

## 2022-06-03 NOTE — Transfer of Care (Signed)
Immediate Anesthesia Transfer of Care Note  Patient: Joyce Ryan  Procedure(s) Performed: LAPAROSCOPY DIAGNOSTIC FOR ECTOPIC PREGNANCY, RIGHT SALPINGECTOMY AND RIGHT OOPHORECTOMY (Abdomen)  Patient Location: PACU  Anesthesia Type:General  Level of Consciousness: awake and alert   Airway & Oxygen Therapy: Patient Spontanous Breathing  Post-op Assessment: Report given to RN and Post -op Vital signs reviewed and stable  Post vital signs: Reviewed and stable  Last Vitals:  Vitals Value Taken Time  BP 149/77 06/03/22 0005  Temp    Pulse 100 06/03/22 0008  Resp 31 06/03/22 0008  SpO2 96 % 06/03/22 0008  Vitals shown include unvalidated device data.  Last Pain:  Vitals:   06/02/22 2146  TempSrc:   PainSc: 7          Complications: No notable events documented.

## 2022-06-03 NOTE — Op Note (Signed)
Joyce Ryan PROCEDURE DATE: 06/02/2022  PREOPERATIVE DIAGNOSIS: Ruptured ectopic pregnancy POSTOPERATIVE DIAGNOSIS: Ruptured right fallopian tube ectopic pregnancy PROCEDURE: Laparoscopic right salpingo-oophorectomy and removal of ectopic pregnancy SURGEON:  Adam Phenix, MD ANESTHESIOLOGIST: Heather Roberts, MD Anesthesiologist: Heather Roberts, MD CRNA: Edmonia Caprio, CRNA; Huel Cote B, CRNA  INDICATIONS: 32 y.o. [phone] at Unknown here with the preoperative diagnoses as listed above.  Please refer to preoperative notes for more details. Patient was counseled regarding need for laparoscopic salpingectomy. Risks of surgery including bleeding which may require transfusion or reoperation, infection, injury to bowel or other surrounding organs, need for additional procedures including laparotomy and other postoperative/anesthesia complications were explained to patient.  Written informed consent was obtained.  FINDINGS:   Pelvic adhesions containing omentum  and moderate amount of hemoperitoneum estimated to be about 100 ml of blood and clots.  Dilated right fallopian tube with bleeding from the tube and ovary with few adhesions containing ectopic gestation. Small normal appearing uterus, normal left fallopian tube, and left ovary.  ANESTHESIA: General INTRAVENOUS FLUIDS: 1000 ml ESTIMATED BLOOD LOSS: 100 ml URINE OUTPUT: 100 ml SPECIMENS: Right fallopian tube and ovary containing ectopic gestation COMPLICATIONS: None immediate  PROCEDURE IN DETAIL:  The patient was taken to the operating room where general anesthesia was administered and was found to be adequate.  She was placed in the dorsal lithotomy position, and was prepped and draped in a sterile manner.  A Foley catheter was inserted into her bladder and attached to constant drainage and a uterine manipulator was then advanced into the uterus .    After an adequate timeout was performed, attention was turned to the abdomen  where an umbilical incision was made with the scalpel. Veress needle was placed and CO2 was insufflated. The 12-mm trocar and sleeve were then advanced without difficulty into the abdomen.  A survey of the patient's pelvis and abdomen revealed the findings above.  Two 5-mm left lower quadrant ports were then placed under direct visualization.  The Nezhat suction irrigator was then used to suction the hemoperitoneum and irrigate the pelvis. Omental adhesions to the pelvic wall were lysed using the Harmonic scalpel.   Attention was then turned to the right fallopian tube. The tube and ovary were hemorrhagic and chorionic tissue was visible through the ruptured tissue. The adnexa  was grasped and ligated from the underlying mesosalpinx and uterine attachment using the Harmonic instrument.  Good hemostasis was noted.  The specimen was placed in an EndoCatch bag and removed from the abdomen intact.  The abdomen was desufflated, and all instruments were removed.  The fascial incision of the 12-mm site was reapproximated with a 0 Vicryl figure-of-eight stitch; and all skin incisions were closed with 4-0 Vicryl and Dermabond. The patient tolerated the procedure well.  All instruments, needles, and sponge counts were correct x 2. The patient was taken to the recovery room in stable condition.   The patient will be discharged to home as per PACU criteria.  Routine postoperative instructions given. She will follow up in the clinic in about 2-3 weeks for postoperative evaluation.   Adam Phenix, MD 06/03/2022 12:07 AM

## 2022-06-06 LAB — SURGICAL PATHOLOGY

## 2023-03-08 ENCOUNTER — Encounter (HOSPITAL_COMMUNITY): Payer: Self-pay | Admitting: *Deleted

## 2023-03-08 ENCOUNTER — Inpatient Hospital Stay (HOSPITAL_COMMUNITY): Payer: Medicaid Other

## 2023-03-08 ENCOUNTER — Observation Stay (HOSPITAL_COMMUNITY)
Admission: AD | Admit: 2023-03-08 | Discharge: 2023-03-08 | DRG: 779 | Disposition: A | Discharge status: Home / Self Care | Payer: Medicaid Other | Attending: Family Medicine | Admitting: Family Medicine

## 2023-03-08 DIAGNOSIS — Z30017 Encounter for initial prescription of implantable subdermal contraceptive: Secondary | ICD-10-CM | POA: Diagnosis not present

## 2023-03-08 DIAGNOSIS — R109 Unspecified abdominal pain: Secondary | ICD-10-CM | POA: Diagnosis present

## 2023-03-08 DIAGNOSIS — O034 Incomplete spontaneous abortion without complication: Secondary | ICD-10-CM | POA: Diagnosis present

## 2023-03-08 DIAGNOSIS — Z3A18 18 weeks gestation of pregnancy: Secondary | ICD-10-CM

## 2023-03-08 DIAGNOSIS — O26892 Other specified pregnancy related conditions, second trimester: Secondary | ICD-10-CM

## 2023-03-08 DIAGNOSIS — Z603 Acculturation difficulty: Secondary | ICD-10-CM | POA: Diagnosis present

## 2023-03-08 DIAGNOSIS — O0933 Supervision of pregnancy with insufficient antenatal care, third trimester: Secondary | ICD-10-CM | POA: Diagnosis not present

## 2023-03-08 DIAGNOSIS — O09292 Supervision of pregnancy with other poor reproductive or obstetric history, second trimester: Secondary | ICD-10-CM | POA: Diagnosis not present

## 2023-03-08 DIAGNOSIS — Z23 Encounter for immunization: Secondary | ICD-10-CM | POA: Insufficient documentation

## 2023-03-08 DIAGNOSIS — R102 Pelvic and perineal pain: Secondary | ICD-10-CM | POA: Diagnosis not present

## 2023-03-08 DIAGNOSIS — Z975 Presence of (intrauterine) contraceptive device: Secondary | ICD-10-CM

## 2023-03-08 DIAGNOSIS — O039 Complete or unspecified spontaneous abortion without complication: Secondary | ICD-10-CM | POA: Diagnosis present

## 2023-03-08 LAB — URINALYSIS, ROUTINE W REFLEX MICROSCOPIC
Bacteria, UA: NONE SEEN
Bilirubin Urine: NEGATIVE
Glucose, UA: NEGATIVE mg/dL
Ketones, ur: NEGATIVE mg/dL
Leukocytes,Ua: NEGATIVE
Nitrite: NEGATIVE
Protein, ur: NEGATIVE mg/dL
Specific Gravity, Urine: 1.029 (ref 1.005–1.030)
pH: 5 (ref 5.0–8.0)

## 2023-03-08 LAB — COMPREHENSIVE METABOLIC PANEL
ALT: 15 U/L (ref 0–44)
AST: 19 U/L (ref 15–41)
Albumin: 3.3 g/dL — ABNORMAL LOW (ref 3.5–5.0)
Alkaline Phosphatase: 51 U/L (ref 38–126)
Anion gap: 13 (ref 5–15)
BUN: 9 mg/dL (ref 6–20)
CO2: 17 mmol/L — ABNORMAL LOW (ref 22–32)
Calcium: 8.9 mg/dL (ref 8.9–10.3)
Chloride: 103 mmol/L (ref 98–111)
Creatinine, Ser: 0.39 mg/dL — ABNORMAL LOW (ref 0.44–1.00)
GFR, Estimated: >60 mL/min (ref >60)
Glucose, Bld: 97 mg/dL (ref 70–99)
Potassium: 4 mmol/L (ref 3.5–5.1)
Sodium: 133 mmol/L — ABNORMAL LOW (ref 135–145)
Total Bilirubin: 0.4 mg/dL (ref 0.0–1.2)
Total Protein: 6.4 g/dL — ABNORMAL LOW (ref 6.5–8.1)

## 2023-03-08 LAB — HCG, QUANTITATIVE, PREGNANCY: hCG, Beta Chain, Quant, S: 5794 m[IU]/mL — ABNORMAL HIGH (ref <5)

## 2023-03-08 LAB — CBC
HCT: 36.8 % (ref 36.0–46.0)
Hemoglobin: 12.1 g/dL (ref 12.0–15.0)
MCH: 28.5 pg (ref 26.0–34.0)
MCHC: 32.9 g/dL (ref 30.0–36.0)
MCV: 86.6 fL (ref 80.0–100.0)
Platelets: 212 10*3/uL (ref 150–400)
RBC: 4.25 MIL/uL (ref 3.87–5.11)
RDW: 13.6 % (ref 11.5–15.5)
WBC: 18.4 10*3/uL — ABNORMAL HIGH (ref 4.0–10.5)
nRBC: 0 % (ref 0.0–0.2)

## 2023-03-08 LAB — GC/CHLAMYDIA PROBE AMP (~~LOC~~) NOT AT ARMC
Chlamydia: NEGATIVE
Comment: NEGATIVE
Comment: NORMAL
Neisseria Gonorrhea: NEGATIVE

## 2023-03-08 LAB — WET PREP, GENITAL
Sperm: NONE SEEN
Trich, Wet Prep: NONE SEEN
WBC, Wet Prep HPF POC: >=10 — AB (ref <10)
Yeast Wet Prep HPF POC: NONE SEEN

## 2023-03-08 LAB — TYPE AND SCREEN
ABO/RH(D): A POS
Antibody Screen: NEGATIVE

## 2023-03-08 LAB — POCT PREGNANCY, URINE: Preg Test, Ur: POSITIVE — AB

## 2023-03-08 MED ORDER — FENTANYL CITRATE (PF) 100 MCG/2ML IJ SOLN
100.0000 ug | INTRAMUSCULAR | Status: DC | PRN
  Administered 2023-03-08 (×2): 100 ug via INTRAVENOUS
  Filled 2023-03-08 (×2): qty 2

## 2023-03-08 MED ORDER — LACTATED RINGERS IV SOLN
500.0000 mL | Freq: Once | INTRAVENOUS | Status: AC
  Administered 2023-03-08: 500 mL via INTRAVENOUS

## 2023-03-08 MED ORDER — DOCUSATE SODIUM 100 MG PO CAPS: 100.0000 mg | ORAL_CAPSULE | Freq: Every day | ORAL | Status: DC

## 2023-03-08 MED ORDER — SODIUM CHLORIDE 0.9% FLUSH: 3.0000 mL | Freq: Two times a day (BID) | INTRAVENOUS | Status: DC

## 2023-03-08 MED ORDER — IBUPROFEN 600 MG PO TABS: 600.0000 mg | ORAL_TABLET | Freq: Four times a day (QID) | ORAL | 2 refills | Status: AC | PRN

## 2023-03-08 MED ORDER — FENTANYL CITRATE (PF) 100 MCG/2ML IJ SOLN
100.0000 ug | Freq: Once | INTRAMUSCULAR | Status: AC
  Administered 2023-03-08: 100 ug via INTRAVENOUS
  Filled 2023-03-08: qty 2

## 2023-03-08 MED ORDER — ACETAMINOPHEN 325 MG PO TABS: 650.0000 mg | ORAL_TABLET | ORAL | 0 refills | Status: AC | PRN

## 2023-03-08 MED ORDER — PRENATAL MULTIVITAMIN CH: 1.0000 | ORAL_TABLET | Freq: Every day | ORAL | Status: DC

## 2023-03-08 MED ORDER — ACETAMINOPHEN 325 MG PO TABS: 650.0000 mg | ORAL_TABLET | ORAL | Status: DC | PRN

## 2023-03-08 MED ORDER — EPHEDRINE 5 MG/ML INJ: 10.0000 mg | INTRAVENOUS | Status: DC | PRN

## 2023-03-08 MED ORDER — CALCIUM CARBONATE ANTACID 500 MG PO CHEW: 2.0000 | CHEWABLE_TABLET | ORAL | Status: DC | PRN

## 2023-03-08 MED ORDER — FENTANYL-BUPIVACAINE-NACL 0.5-0.125-0.9 MG/250ML-% EP SOLN
12.0000 mL/h | EPIDURAL | Status: DC | PRN
  Filled 2023-03-08: qty 250

## 2023-03-08 MED ORDER — PHENYLEPHRINE 80 MCG/ML (10ML) SYRINGE FOR IV PUSH (FOR BLOOD PRESSURE SUPPORT): 80.0000 ug | PREFILLED_SYRINGE | INTRAVENOUS | Status: DC | PRN

## 2023-03-08 MED ORDER — ETONOGESTREL 68 MG ~~LOC~~ IMPL
68.0000 mg | DRUG_IMPLANT | Freq: Once | SUBCUTANEOUS | Status: AC
  Administered 2023-03-08: 68 mg via SUBCUTANEOUS
  Filled 2023-03-08 (×2): qty 1

## 2023-03-08 MED ORDER — DIPHENHYDRAMINE HCL 50 MG/ML IJ SOLN: 12.5000 mg | INTRAMUSCULAR | Status: DC | PRN

## 2023-03-08 MED ORDER — ONDANSETRON HCL 4 MG/2ML IJ SOLN
4.0000 mg | Freq: Once | INTRAMUSCULAR | Status: AC
  Administered 2023-03-08: 4 mg via INTRAVENOUS
  Filled 2023-03-08: qty 2

## 2023-03-08 MED ORDER — LIDOCAINE HCL 1 % IJ SOLN
0.0000 mL | Freq: Once | INTRAMUSCULAR | Status: AC | PRN
  Administered 2023-03-08: 20 mL via INTRADERMAL
  Filled 2023-03-08: qty 20

## 2023-03-08 MED ORDER — SODIUM CHLORIDE 0.9% FLUSH: 3.0000 mL | INTRAVENOUS | Status: DC | PRN

## 2023-03-08 NOTE — H&P (Signed)
Chief Complaint: Abdominal Pain and Vaginal Bleeding   Event Date/Time   First Provider Initiated Contact with Patient 03/08/23 0515        SUBJECTIVE HPI: Joyce Ryan is a 33 y.o. [id number] at Unknown by LMP who presents to maternity admissions reporting cramping and bleeding since yesterday morning.  States is not bleeding much.  Is very distressed with pain on admission.  . ROS:  Level 5 Caveat due to severity of patient condition  History is remarkable for C/S x 2, Preeclampsia with first baby, then a 12 week SAB and a ruptured ectopic in May 2023  Abdominal Pain This is a new problem. The current episode started yesterday. The pain is at a severity of 10/10. The pain is severe. The quality of the pain is cramping. Pertinent negatives include no fever, nausea or vomiting. Nothing aggravates the pain. The pain is relieved by Nothing. She has tried nothing for the symptoms.   RN Note: .Joyce Ryan is a 33 y.o. at Unknown here in MAU reporting bleeding and cramping since Tues am. States her last period was in October but unsure when. States bleeding is light and pink  LMP: sometime in oct Onset of complaint: Tues am                       Pain score: 10  History reviewed. No pertinent past medical history. Past Surgical History:  Procedure Laterality Date   CESAREAN SECTION     LAPAROSCOPY N/A 06/02/2022   Procedure: LAPAROSCOPY DIAGNOSTIC FOR ECTOPIC PREGNANCY, RIGHT SALPINGECTOMY AND RIGHT OOPHORECTOMY;  Surgeon: Adam Phenix, MD;  Location: [facility] OR;  Service: Gynecology;  Laterality: N/A;   Social History   Socioeconomic History   Marital status: Single    Spouse name: Not on file   Number of children: Not on file   Years of education: Not on file   Highest education level: Not on file  Occupational History   Not on file  Tobacco Use   Smoking status: Every Day   Smokeless tobacco: Never  Substance and Sexual Activity   Alcohol use: Yes   Drug use:  Never   Sexual activity: Yes  Other Topics Concern   Not on file  Social History Narrative   Not on file   Social Drivers of Health   Financial Resource Strain: Not on file  Food Insecurity: Not on file  Transportation Needs: Not on file  Physical Activity: Not on file  Stress: Not on file  Social Connections: Not on file  Intimate Partner Violence: Not on file   No current facility-administered medications on file prior to encounter.   Current Outpatient Medications on File Prior to Encounter  Medication Sig Dispense Refill   ibuprofen (ADVIL) 600 MG tablet Take 1 tablet (600 mg total) by mouth every 6 (six) hours as needed for headache, mild pain, moderate pain or cramping. 30 tablet 2   oxyCODONE-acetaminophen (PERCOCET/ROXICET) 5-325 MG tablet Take 1-2 tablets by mouth every 6 (six) hours as needed for severe pain. 20 tablet 0   No Known Allergies  I have reviewed patient's Past Medical Hx, Surgical Hx, Family Hx, Social Hx, medications and allergies.   ROS:  Review of Systems  Constitutional:  Negative for fever.  Gastrointestinal:  Positive for abdominal pain. Negative for nausea and vomiting.   Review of Systems  Other systems negative   Physical Exam  Physical Exam Patient Vitals for the past 24 hrs:  BP Temp Pulse Resp SpO2 Height Weight  03/08/23 0431 (!) 92/54 -- -- -- -- -- --  03/08/23 0426 -- 97.8 F (36.6 C) 92 18 100 % 5\' 3"  (1.6 m) 77.1 kg   Constitutional: Well-developed, well-nourished female in significant pain  Cardiovascular: normal rate Respiratory: normal effort GI: Abd soft, non-tender.  MS: Extremities nontender, no edema, normal ROM Neurologic: Alert and oriented x 4.  GU: Neg CVAT.  PELVIC EXAM: Fetal head in vagina at -3.  Membranes intact  LAB RESULTS Ordered  --/--/A POS (05/09 2107)  IMAGING Single IUP at [redacted]w[redacted]d weeks Fetal head in cervix FHT 169 Anterior Placenta  MAU Management/MDM: I have reviewed the triage vital  signs and the nursing notes.   Pertinent labs & imaging results that were available during my care of the patient were reviewed by me and considered in my medical decision making (see chart for details).      I have reviewed her medical records including past results, notes and treatments. Medical, Surgical, and family history were reviewed.  Medications and recent lab tests were reviewed  Ordered usual first trimester r/o ectopic labs.   Pelvic exam done Fetal head in vagina US done at bedside to confirm dates and status   Fetus is alive, +FHR Fentanyl and Zofran given IV.   Consult Dr Jolayne Panther with presentation, exam findings, and results.   Treatments in MAU included Ultrasound.   This bleeding/pain can represent a normal pregnancy with bleeding, spontaneous abortion or even an ectopic which can be life-threatening.  The process as listed above helps to determine which of these is present.    ASSESSMENT Single IUP at [redacted]w[redacted]d by LMP, [redacted]w[redacted]d by [place] Inevitable spontaneous abortion History of Cesarean Delivery x 2   PLAN Admit to [facility] Observation Analgesia PRN MD to follow   Wynelle Bourgeois CNM, MSN Certified Nurse-Midwife 03/08/2023  5:15 AM

## 2023-03-08 NOTE — MAU Note (Signed)
.  Joyce Ryan is a 33 y.o. at Unknown here in MAU reporting bleeding and cramping since Tues am. States her last period was in October but unsure when. States bleeding is light and pink  LMP: sometime in oct Onset of complaint: Tues am Pain score: 10 Vitals:   03/08/23 0426 03/08/23 0431  BP:  (!) 92/54  Pulse: 92   Resp: 18   Temp: 97.8 F (36.6 C)   SpO2: 100%      FHT: n/a  Lab orders placed from triage: upt

## 2023-03-08 NOTE — Progress Notes (Signed)
At bedside with anesthesiologist. Explain to patient via interpreter that when she sits up for her epidural there is a chance baby will deliver. Patient verbalized understanding. Patient sat up on side of bed and immediately states she needs to go to the restroom. Advised patient she had to stay in the bed. Patient leans to the side and repeats that she needs to use the restroom. Patient laid back in bed and immediately ruptures. Infant delivered with rupturing of membranes. Placenta followed a minute after, faculty notified of delivery.

## 2023-03-08 NOTE — Discharge Summary (Signed)
Postpartum Discharge Summary     Patient Name: Joyce Ryan DOB: [birth year] MRN: [id number]  Date of admission: 03/08/2023 Delivery date:03/08/2023 Delivering provider: Ophelia Charter D Date of discharge: 03/08/2023  Admitting diagnosis: Inevitable abortion [O03.4] Inevitable complete miscarriage without complication [O03.9] Intrauterine pregnancy: [redacted]w[redacted]d     Secondary diagnosis:  Principal Problem:   Inevitable abortion Active Problems:   Fetal demise due to miscarriage   Inevitable complete miscarriage without complication  Additional problems:     Discharge diagnosis:  Fetal demise due to miscarriage                                               Post partum procedures: Nexplanon placement Augmentation: N/A Complications: None  Hospital course: Onset of Labor With Vaginal Delivery      33 y.o. yo [id number] at [redacted]w[redacted]d was admitted in Active Labor on 03/08/2023. Patient arrived to MAU completely dilated and was transferred to L&D. Plan had been to get an epidural but as soon as she sat up baby delivered shortly thereafter along with placenta. A few hours after delivery patient expressed a desire to go home and for contraception to be placed. Nexplanon was placed prior to discharge. Patient declined SW and pastoral care. Message sent to [facility] to coordinate follow up visit.   Membrane Rupture Time/Date: 7:37 AM,03/08/2023  Delivery Method:VBAC, Spontaneous Operative Delivery:N/A Episiotomy: None Lacerations:  None  Patient is discharged home in stable condition on 03/08/23.  Newborn Data: Birth date:03/08/2023 Birth time:7:37 AM Gender:Female Living status:Living Apgars:3 ,3  Weight:230 g  Magnesium Sulfate received: No BMZ received: No Rhophylac:N/A MMR: T-DaP: Flu:  RSV Vaccine received:  Transfusion:  Immunizations received:  There is no immunization history on file for this patient.  Physical exam  Vitals:   03/08/23 0901 03/08/23 0932 03/08/23 1001  03/08/23 1032  BP: (!) 129/46 95/76 113/68 (!) 113/58  Pulse: 88 (!) 57 66 (!) 55  Resp:      Temp:   98.1 F (36.7 C)   TempSrc:      SpO2:      Weight:      Height:       General: alert, cooperative, and no distress Lochia: appropriate Uterine Fundus: firm Incision: N/A DVT Evaluation: No evidence of DVT seen on physical exam. Labs: Lab Results  Component Value Date   WBC 18.4 (H) 03/08/2023   HGB 12.1 03/08/2023   HCT 36.8 03/08/2023   MCV 86.6 03/08/2023   PLT 212 03/08/2023      Latest Ref Rng & Units 03/08/2023    4:58 AM  CMP  Glucose 70 - 99 mg/dL 97   BUN 6 - 20 mg/dL 9   Creatinine 1.47 - 8.29 mg/dL 5.62   Sodium 130 - 865 mmol/L 133   Potassium 3.5 - 5.1 mmol/L 4.0   Chloride 98 - 111 mmol/L 103   CO2 22 - 32 mmol/L 17   Calcium 8.9 - 10.3 mg/dL 8.9   Total Protein 6.5 - 8.1 g/dL 6.4   Total Bilirubin 0.0 - 1.2 mg/dL 0.4   Alkaline Phos 38 - 126 U/L 51   AST 15 - 41 U/L 19   ALT 0 - 44 U/L 15    Edinburgh Score:     No data to display         No data recorded  After visit  meds:  Allergies as of 03/08/2023   No Known Allergies      Medication List     STOP taking these medications    oxyCODONE-acetaminophen 5-325 MG tablet Commonly known as: PERCOCET/ROXICET       TAKE these medications    acetaminophen 325 MG tablet Commonly known as: TYLENOL Take 2 tablets (650 mg total) by mouth every 4 (four) hours as needed (for pain scale < 4  OR  temperature  >/=  100.5 F).   ibuprofen 600 MG tablet Commonly known as: ADVIL Take 1 tablet (600 mg total) by mouth every 6 (six) hours as needed for headache, mild pain (pain score 1-3), moderate pain (pain score 4-6) or cramping.         Discharge home in stable condition Infant Feeding: n/a Infant Disposition:morgue Discharge instruction: per After Visit Summary and Postpartum booklet. Activity: Advance as tolerated. Pelvic rest for 6 weeks.  Diet: routine diet Future Appointments:No  future appointments. Follow up Visit:   Please schedule this patient for a In person postpartum visit in  2 weeks  with the following provider: Any provider. Additional Postpartum F/U:Postpartum Depression checkup  High risk pregnancy complicated by:  fetal loss at 18 weeks Delivery mode:  VBAC, Spontaneous Anticipated Birth Control:  PP Nexplanon placed   03/08/2023 Venora Maples, MD

## 2023-03-08 NOTE — Progress Notes (Addendum)
Late note entry  Called by nursing staff around 7:30am regarding eminent delivery. Upon arrival in the room, fetus and placenta were delivered whole and intact. Patient comfortable with epidural. Minimal vaginal bleeding and no vaginal tear  Emotional support provided Spanish interpreter present during encounter

## 2023-03-08 NOTE — MAU Provider Note (Signed)
Chief Complaint: Abdominal Pain and Vaginal Bleeding   Event Date/Time   First Provider Initiated Contact with Patient 03/08/23 0515        SUBJECTIVE HPI: Joyce Ryan is a 33 y.o. [id number] at Unknown by LMP who presents to maternity admissions reporting cramping and bleeding since yesterday morning.  States is not bleeding much.  Is very distressed with pain on admission.  . ROS:  Level 5 Caveat due to severity of patient condition  History is remarkable for C/S x 2, Preeclampsia with first baby, then a 12 week SAB and a ruptured ectopic in May 2023  Abdominal Pain This is a new problem. The current episode started yesterday. The pain is at a severity of 10/10. The pain is severe. The quality of the pain is cramping. Pertinent negatives include no fever, nausea or vomiting. Nothing aggravates the pain. The pain is relieved by Nothing. She has tried nothing for the symptoms.   RN Note: .Joyce Ryan is a 33 y.o. at Unknown here in MAU reporting bleeding and cramping since Tues am. States her last period was in October but unsure when. States bleeding is light and pink  LMP: sometime in oct Onset of complaint: Tues am                       Pain score: 10  History reviewed. No pertinent past medical history. Past Surgical History:  Procedure Laterality Date   CESAREAN SECTION     LAPAROSCOPY N/A 06/02/2022   Procedure: LAPAROSCOPY DIAGNOSTIC FOR ECTOPIC PREGNANCY, RIGHT SALPINGECTOMY AND RIGHT OOPHORECTOMY;  Surgeon: Adam Phenix, MD;  Location: [facility] OR;  Service: Gynecology;  Laterality: N/A;   Social History   Socioeconomic History   Marital status: Single    Spouse name: Not on file   Number of children: Not on file   Years of education: Not on file   Highest education level: Not on file  Occupational History   Not on file  Tobacco Use   Smoking status: Every Day   Smokeless tobacco: Never  Substance and Sexual Activity   Alcohol use: Yes   Drug use:  Never   Sexual activity: Yes  Other Topics Concern   Not on file  Social History Narrative   Not on file   Social Drivers of Health   Financial Resource Strain: Not on file  Food Insecurity: Not on file  Transportation Needs: Not on file  Physical Activity: Not on file  Stress: Not on file  Social Connections: Not on file  Intimate Partner Violence: Not on file   No current facility-administered medications on file prior to encounter.   Current Outpatient Medications on File Prior to Encounter  Medication Sig Dispense Refill   ibuprofen (ADVIL) 600 MG tablet Take 1 tablet (600 mg total) by mouth every 6 (six) hours as needed for headache, mild pain, moderate pain or cramping. 30 tablet 2   oxyCODONE-acetaminophen (PERCOCET/ROXICET) 5-325 MG tablet Take 1-2 tablets by mouth every 6 (six) hours as needed for severe pain. 20 tablet 0   No Known Allergies  I have reviewed patient's Past Medical Hx, Surgical Hx, Family Hx, Social Hx, medications and allergies.   ROS:  Review of Systems  Constitutional:  Negative for fever.  Gastrointestinal:  Positive for abdominal pain. Negative for nausea and vomiting.   Review of Systems  Other systems negative   Physical Exam  Physical Exam Patient Vitals for the past 24 hrs:  BP Temp Pulse Resp SpO2 Height Weight  03/08/23 0431 (!) 92/54 -- -- -- -- -- --  03/08/23 0426 -- 97.8 F (36.6 C) 92 18 100 % 5\' 3"  (1.6 m) 77.1 kg   Constitutional: Well-developed, well-nourished female in significant pain  Cardiovascular: normal rate Respiratory: normal effort GI: Abd soft, non-tender.  MS: Extremities nontender, no edema, normal ROM Neurologic: Alert and oriented x 4.  GU: Neg CVAT.  PELVIC EXAM: Fetal head in vagina at -3.  Membranes intact  LAB RESULTS Ordered  --/--/A POS (05/09 2107)  IMAGING Single IUP at [redacted]w[redacted]d weeks Fetal head in cervix FHT 169 Anterior Placenta  MAU Management/MDM: I have reviewed the triage vital  signs and the nursing notes.   Pertinent labs & imaging results that were available during my care of the patient were reviewed by me and considered in my medical decision making (see chart for details).      I have reviewed her medical records including past results, notes and treatments. Medical, Surgical, and family history were reviewed.  Medications and recent lab tests were reviewed  Ordered usual first trimester r/o ectopic labs.   Pelvic exam done Fetal head in vagina US done at bedside to confirm dates and status   Fetus is alive, +FHR Fentanyl and Zofran given IV.   Consult Dr Jolayne Panther with presentation, exam findings, and results.   Treatments in MAU included Ultrasound.   This bleeding/pain can represent a normal pregnancy with bleeding, spontaneous abortion or even an ectopic which can be life-threatening.  The process as listed above helps to determine which of these is present.    ASSESSMENT Single IUP at [redacted]w[redacted]d by LMP, [redacted]w[redacted]d by [place] Inevitable spontaneous abortion History of Cesarean Delivery x 2   PLAN Admit to [facility] Observation Analgesia PRN MD to follow   Wynelle Bourgeois CNM, MSN Certified Nurse-Midwife 03/08/2023  5:15 AM

## 2023-03-15 ENCOUNTER — Telehealth (HOSPITAL_COMMUNITY): Payer: Self-pay | Admitting: *Deleted

## 2023-03-15 DIAGNOSIS — Z1331 Encounter for screening for depression: Secondary | ICD-10-CM

## 2023-03-15 NOTE — Telephone Encounter (Signed)
 03/15/2023  Name: Joyce Ryan MRN: [id number] DOB: 29-Apr-1990  Reason for Call:  Transition of Care Hospital Discharge Call  Contact Status: Patient Contact Status: Complete  Language assistant needed: Interpreter Mode: Telephonic Interpreter Interpreter Name: Harrison Mons  #[id number] Interpreter Phone Number - If applicable: Language Line [phone]        Follow-Up Questions: Do You Have Any Concerns About Your Health As You Heal From Delivery?: No  Edinburgh Postnatal Depression Scale:  In the Past 7 Days: I have been able to laugh and see the funny side of things.: Definitely not so much now I have looked forward with enjoyment to things.: Hardly at all I have blamed myself unnecessarily when things went wrong.: Yes, most of the time I have been anxious or worried for no good reason.: No, not at all I have felt scared or panicky for no good reason.: No, not at all Things have been getting on top of me.: No, most of the time I have coped quite well I have been so unhappy that I have had difficulty sleeping.: Not at all I have felt sad or miserable.: Not very often I have been so unhappy that I have been crying.: Only occasionally The thought of harming myself has occurred to me.: Never Inocente Salles Postnatal Depression Scale Total: (!) 11 Patient agree for RN to  place [facility] referral. Dr. Crissie Reese notified via chart. Mental health resources emailed to patient as well. PHQ2-9 Depression Scale:     Discharge Follow-up: Edinburgh score requires follow up?: Yes Provider notified of Edinburgh score?: Yes (Dr. Crissie Reese notified via chart message.) Have you already been referred for a counseling appointment?: No Patient referred to:: Other (comment) (referral placed to integrated behavioral health)  Post-discharge interventions: Maternal Mental Health Resources provided Placed [facility] referral in patient's chart  Signature Deforest Hoyles, RN, 03/15/23, [phone]

## 2023-03-16 ENCOUNTER — Telehealth: Payer: Self-pay | Admitting: Family Medicine

## 2023-03-16 NOTE — Telephone Encounter (Signed)
 Message was left with the help of an Interpreter about her appointment.

## 2023-03-31 ENCOUNTER — Ambulatory Visit: Payer: Medicaid Other | Admitting: Family Medicine
